# Patient Record
Sex: Male | Born: 1980 | Race: White | Hispanic: No | Marital: Married | Smoking: Never smoker
Health system: Southern US, Community
[De-identification: ages and names within clinical notes are randomized; demographics above are authoritative.]

## PROBLEM LIST (undated history)

## (undated) DIAGNOSIS — Q249 Congenital malformation of heart, unspecified: Secondary | ICD-10-CM

## (undated) DIAGNOSIS — I471 Supraventricular tachycardia, unspecified: Secondary | ICD-10-CM

## (undated) DIAGNOSIS — B019 Varicella without complication: Secondary | ICD-10-CM

## (undated) HISTORY — DX: Supraventricular tachycardia: I47.1

## (undated) HISTORY — PX: VASECTOMY: SHX75

## (undated) HISTORY — DX: Congenital malformation of heart, unspecified: Q24.9

## (undated) HISTORY — DX: Supraventricular tachycardia, unspecified: I47.10

## (undated) HISTORY — DX: Varicella without complication: B01.9

---

## 2004-06-20 HISTORY — PX: ABLATION: SHX5711

## 2017-01-30 ENCOUNTER — Encounter: Payer: Self-pay | Admitting: Primary Care

## 2017-01-30 ENCOUNTER — Encounter (INDEPENDENT_AMBULATORY_CARE_PROVIDER_SITE_OTHER): Payer: Self-pay

## 2017-01-30 ENCOUNTER — Ambulatory Visit (INDEPENDENT_AMBULATORY_CARE_PROVIDER_SITE_OTHER): Payer: Managed Care, Other (non HMO) | Admitting: Primary Care

## 2017-01-30 VITALS — BP 122/78 | HR 60 | Temp 98.0°F | Ht 72.0 in | Wt 191.8 lb

## 2017-01-30 DIAGNOSIS — Z23 Encounter for immunization: Secondary | ICD-10-CM

## 2017-01-30 DIAGNOSIS — I471 Supraventricular tachycardia: Secondary | ICD-10-CM | POA: Insufficient documentation

## 2017-01-30 DIAGNOSIS — Z0000 Encounter for general adult medical examination without abnormal findings: Secondary | ICD-10-CM | POA: Diagnosis not present

## 2017-01-30 DIAGNOSIS — Z3009 Encounter for other general counseling and advice on contraception: Secondary | ICD-10-CM

## 2017-01-30 LAB — LIPID PANEL
CHOLESTEROL: 112 mg/dL (ref 0–200)
HDL: 45.2 mg/dL (ref >39.00)
LDL CALC: 56 mg/dL (ref 0–99)
NonHDL: 66.46
Total CHOL/HDL Ratio: 2
Triglycerides: 51 mg/dL (ref 0.0–149.0)
VLDL: 10.2 mg/dL (ref 0.0–40.0)

## 2017-01-30 LAB — COMPREHENSIVE METABOLIC PANEL
ALBUMIN: 4.5 g/dL (ref 3.5–5.2)
ALK PHOS: 55 U/L (ref 39–117)
ALT: 15 U/L (ref 0–53)
AST: 16 U/L (ref 0–37)
BUN: 16 mg/dL (ref 6–23)
CO2: 27 mEq/L (ref 19–32)
CREATININE: 0.93 mg/dL (ref 0.40–1.50)
Calcium: 9.5 mg/dL (ref 8.4–10.5)
Chloride: 104 mEq/L (ref 96–112)
GFR: 98.5 mL/min (ref >60.00)
Glucose, Bld: 90 mg/dL (ref 70–99)
Potassium: 4.7 mEq/L (ref 3.5–5.1)
SODIUM: 139 meq/L (ref 135–145)
TOTAL PROTEIN: 6.8 g/dL (ref 6.0–8.3)
Total Bilirubin: 0.5 mg/dL (ref 0.2–1.2)

## 2017-01-30 NOTE — Patient Instructions (Addendum)
You were provided with a tetanus vaccination which will cover you for 10 years.  Complete lab work prior to leaving today. I will notify you of your results once received.   Start exercising. You should be getting 150 minutes of moderate intensity exercise weekly.  Ensure you are consuming 64 ounces of water daily.  Increase consumption of vegetables, fruit, whole grains.   You will be contacted regarding your referral to Urology.  Please let us know if you have not heard back within one week.   Follow up in 1 year for your annual exam or sooner if needed.  It was a pleasure to meet you today! Please don't hesitate to call me with any questions. Welcome to [facility]!

## 2017-01-30 NOTE — Assessment & Plan Note (Signed)
Following with cardiology through [facility], stable on flecainide. Continue same.

## 2017-01-30 NOTE — Progress Notes (Signed)
Subjective:    Patient ID: Robert Harrell, male    DOB: Nov 13, 1980, 36 y.o.   MRN: [id number]  HPI  Mr. Pokorski is a 36 year old male who presents today to establish care and for complete physical. He would like a referral to for vasectomy. Will obtain old records.   1) Paroxysmal SVT: Currently following with cardiology through [facility] and managed on Flecainide 50 mg once daily. Prior failed ablation in 2006. Family history of SVT in maternal grandmother. He denies chest pain, palpitations, dizziness. He is very aware of his symptoms with tachycardia and can typically yawn or stretch to prevent tachycardia.   2) Vasectomy Referral: Currently has two children (83 year old and 10 month old). Would like to have this procedure as he and his wife are finished having children.   Immunizations: -Tetanus: Completed over 10 years ago. -Influenza: Never completes.    Diet: Over the past 4 weeks he's eating a ketogenic and 816 diet. Breakfast: Berniece Salines, eggs Lunch: Salad with protein; fast food without bread Dinner: Meat, vegetable, cheese Snacks: Occasionally nuts, deli meat Desserts: None Beverages: Water, whisky   Exercise: Currently not exercising  Eye exam: Not completed recently Dental exam: Follows semi-annually   Review of Systems  Constitutional: Negative for unexpected weight change.  HENT: Negative for rhinorrhea.   Eyes: Negative for visual disturbance.  Respiratory: Negative for cough and shortness of breath.   Cardiovascular: Negative for chest pain.  Gastrointestinal: Negative for constipation and diarrhea.  Genitourinary: Negative for difficulty urinating.       Would like vasectomy referral  Musculoskeletal: Negative for arthralgias and myalgias.  Skin: Negative for rash.  Allergic/Immunologic: Negative for environmental allergies.  Neurological: Negative for dizziness, numbness and headaches.  Psychiatric/Behavioral:       Denies concerns for anxiety and depression         Past Medical History:  Diagnosis Date  . Cardiac arrhythmia due to congenital heart disease   . Chickenpox      Social History   Social History  . Marital status: Married    Spouse name: N/A  . Number of children: N/A  . Years of education: N/A   Occupational History  . Not on file.   Social History Main Topics  . Smoking status: Never Smoker  . Smokeless tobacco: Never Used  . Alcohol use Yes  . Drug use: Unknown  . Sexual activity: Not on file   Other Topics Concern  . Not on file   Social History Narrative   Married.   2 children.   Works as a Hotel manager for [employer].    Enjoys spending time with family, swimming.     No past surgical history on file.  Family History  Problem Relation Age of Onset  . Mental illness Mother   . Mental illness Sister   . Breast cancer Maternal Grandmother   . Heart disease Maternal Grandmother   . Stroke Maternal Grandmother   . Heart disease Maternal Grandfather   . Stroke Maternal Grandfather   . Lung cancer Paternal Grandmother   . Alcohol abuse Paternal Grandfather   . Diabetes Paternal Grandfather     No Known Allergies  No current outpatient prescriptions on file prior to visit.   No current facility-administered medications on file prior to visit.     BP 122/78   Pulse 60   Temp 98 F (36.7 C) (Oral)   Ht 6' (1.829 m)   Wt 191 lb 12.8 oz (87  kg)   SpO2 97%   BMI 26.01 kg/m    Objective:   Physical Exam  Constitutional: He is oriented to person, place, and time. He appears well-nourished.  HENT:  Right Ear: Tympanic membrane and ear canal normal.  Left Ear: Tympanic membrane and ear canal normal.  Nose: Nose normal. Right sinus exhibits no maxillary sinus tenderness and no frontal sinus tenderness. Left sinus exhibits no maxillary sinus tenderness and no frontal sinus tenderness.  Mouth/Throat: Oropharynx is clear and moist.  Eyes: Pupils are equal, round, and reactive to light.  Conjunctivae and EOM are normal.  Neck: Neck supple. Carotid bruit is not present. No thyromegaly present.  Cardiovascular: Normal rate, regular rhythm and normal heart sounds.   Pulmonary/Chest: Effort normal and breath sounds normal. He has no wheezes. He has no rales.  Abdominal: Soft. Bowel sounds are normal. There is no tenderness.  Musculoskeletal: Normal range of motion.  Neurological: He is alert and oriented to person, place, and time. He has normal reflexes. No cranial nerve deficit.  Skin: Skin is warm and dry.  Psychiatric: He has a normal mood and affect.          Assessment & Plan:

## 2017-01-30 NOTE — Addendum Note (Signed)
Addended by: Jacqualin Combes on: 01/30/2017 12:48 PM   Modules accepted: Orders

## 2017-01-30 NOTE — Assessment & Plan Note (Signed)
Tetanus due, provided today. Recommended he increase vegetables, fruit, whole grains. Recommended to start exercising. Exam unremarkable. Labs pending. Follow up in 1 year for annual exam.

## 2017-02-22 ENCOUNTER — Encounter: Payer: Self-pay | Admitting: Urology

## 2017-02-22 ENCOUNTER — Ambulatory Visit (INDEPENDENT_AMBULATORY_CARE_PROVIDER_SITE_OTHER): Payer: Managed Care, Other (non HMO) | Admitting: Urology

## 2017-02-22 VITALS — BP 126/87 | HR 77 | Ht 72.0 in | Wt 180.0 lb

## 2017-02-22 DIAGNOSIS — Z3009 Encounter for other general counseling and advice on contraception: Secondary | ICD-10-CM

## 2017-02-22 MED ORDER — DIAZEPAM 10 MG PO TABS: 10.0000 mg | ORAL_TABLET | Freq: Once | ORAL | 0 refills | Status: AC

## 2017-02-22 NOTE — Progress Notes (Signed)
02/22/2017 4:28 PM   Robert Harrell 02/27/1981 [id number]  Referring provider: [name], NP [name], [place] [address]  CC: Vasectomy consult  HPI:  36 year old male who presents today for possible cystoscopy. He is married with 2 children. He does wife desire no further biological pregnancies. They're currently not using any birth control methods" doing a bad job."  They consider other forms of birth control her most interested in vasectomy.  He denies any history of testicular pain or trauma. No urinary symptoms.   PMH: Past Medical History:  Diagnosis Date  . Cardiac arrhythmia due to congenital heart disease   . Chickenpox     Surgical History: Past Surgical History:  Procedure Laterality Date  . ABLATION  2006   Catheter    Home Medications:  Allergies as of 02/22/2017   No Known Allergies     Medication List       Accurate as of 02/22/17  4:28 PM. Always use your most recent med list.          flecainide 50 MG tablet Commonly known as:  TAMBOCOR Take 50 mg by mouth once.       Allergies: No Known Allergies  Family History: Family History  Problem Relation Age of Onset  . Mental illness Mother   . Mental illness Sister   . Breast cancer Maternal Grandmother   . Heart disease Maternal Grandmother   . Stroke Maternal Grandmother   . Heart disease Maternal Grandfather   . Stroke Maternal Grandfather   . Lung cancer Paternal Grandmother   . Alcohol abuse Paternal Grandfather   . Diabetes Paternal Grandfather     Social History:  reports that he has never smoked. He has never used smokeless tobacco. He reports that he drinks about 0.6 oz of alcohol per week . His drug history is not on file.  ROS: UROLOGY Frequent Urination?: No Hard to postpone urination?: No Burning/pain with urination?: No Get up at night to urinate?: No Leakage of urine?: No Urine stream starts and stops?: No Trouble starting stream?: No Do you  have to strain to urinate?: No Blood in urine?: No Urinary tract infection?: No Sexually transmitted disease?: No Injury to kidneys or bladder?: No Painful intercourse?: No Weak stream?: No Erection problems?: No Penile pain?: No  Gastrointestinal Nausea?: No Vomiting?: No Indigestion/heartburn?: No Diarrhea?: No Constipation?: No  Constitutional Fever: No Night sweats?: No Weight loss?: No Fatigue?: No  Skin Skin rash/lesions?: No Itching?: No  Eyes Blurred vision?: No Double vision?: No  Ears/Nose/Throat Sore throat?: No Sinus problems?: No  Hematologic/Lymphatic Swollen glands?: No Easy bruising?: No  Cardiovascular Leg swelling?: No Chest pain?: No  Respiratory Cough?: No Shortness of breath?: No  Endocrine Excessive thirst?: No  Musculoskeletal Back pain?: No Joint pain?: No  Neurological Headaches?: No Dizziness?: No  Psychologic Depression?: No Anxiety?: No  Physical Exam: BP 126/87 (BP Location: Left Arm, Patient Position: Sitting, Cuff Size: Normal)   Pulse 77   Ht 6' (1.829 m)   Wt 180 lb (81.6 kg)   BMI 24.41 kg/m   Constitutional:  Alert and oriented, No acute distress. HEENT: [place] AT, moist mucus membranes.  Trachea midline, no masses. Cardiovascular: No clubbing, cyanosis, or edema. Respiratory: Normal respiratory effort, no increased work of breathing. GI: Abdomen is soft, nontender, nondistended, no abdominal masses GU: Status post with bilateral descended testicles. Bilateral vasa easily palpable. Skin: No rashes, bruises or suspicious lesions. Neurologic: Grossly intact, no focal  deficits, moving all 4 extremities. Psychiatric: Normal mood and affect.  Laboratory Data:  Lab Results  Component Value Date   CREATININE 0.93 01/30/2017    Assessment & Plan:    1. Vasectomy evaluation Today, we discussed what the vas deferens is, where it is located, and its function. We reviewed the procedure for vasectomy, it's  risks, benefits, alternatives, and likelihood of achieving his goals. We discussed in detail the procedure, complications, and recovery as well as the need for clearance prior to unprotected intercourse. We discussed that vasectomy does not protect against sexually transmitted diseases. We discussed that this procedure does not result in immediate sterility and that they would need to use other forms of birth control until he has been cleared with negative postvasectomy semen analyses. I explained that the procedure is considered to be permanent and that attempts at reversal have varying degrees of success. These options include vasectomy reversal, sperm retrieval, and in vitro fertilization; these can be very expensive. We discussed the chance of postvasectomy pain syndrome which occurs in less than 5% of patients. I explained to the patient that there is no treatment to resolve this chronic pain, and that if it developed I would not be able to help resolve the issue, but that surgery is generally not needed for correction. I explained there have even been reports of systemic like illness associated with this chronic pain, and that there was no good cure. I explained that vasectomy it is not a 100% reliable form of birth control, and the risk of pregnancy after vasectomy is approximately 1 in 2000 men who had a negative postvasectomy semen analysis or rare non-motile sperm. I explained that repeat vasectomy was necessary in less than 1% of vasectomy procedures when employing the type of technique that I use. I explained that he should refrain from ejaculation for approximately one week following vasectomy. I explained that there are other options for birth control which are permanent and non-permanent; we discussed these. I explained the rates of surgical complications, such as symptomatic hematoma or infection, are low (1-2%) and vary with the surgeon's experience and criteria used to diagnose the complication.    The patient had the opportunity to ask questions to his stated satisfaction. He voiced understanding of the above factors and stated that he has read all the information provided to him and the packets and informed consent   Valium prescription given today, advised to have a driver.  Hollice Espy, MD  [facility] [address] [place],  [address] [phone]

## 2017-04-21 ENCOUNTER — Encounter: Payer: Self-pay | Admitting: Urology

## 2017-04-21 ENCOUNTER — Ambulatory Visit (INDEPENDENT_AMBULATORY_CARE_PROVIDER_SITE_OTHER): Payer: Managed Care, Other (non HMO) | Admitting: Urology

## 2017-04-21 VITALS — BP 131/82 | HR 83 | Ht 72.0 in | Wt 183.0 lb

## 2017-04-21 DIAGNOSIS — Z302 Encounter for sterilization: Secondary | ICD-10-CM

## 2017-04-21 NOTE — Progress Notes (Signed)
04/21/17  CC:  Chief Complaint  Patient presents with  . VAS    HPI: 36 year old male who presents today for vasectomy.  Risks and benefits were previously discussed.  All questions were answered.  Timeout was performed prior to the procedure.  Blood pressure 131/82, pulse 83, height 6' (1.829 m), weight 183 lb (83 kg). NED. A&Ox3.   No respiratory distress   Abd soft, NT, ND Normal external genitalia with patent urethral meatus   Bilateral Vasectomy Procedure  Pre-Procedure: - Patient's scrotum was prepped and draped for vasectomy. - The vas was palpated through the scrotal skin on the left. - 1% Xylocaine was injected into the skin and surrounding tissue for placement  - In a similar manner, the vas on the right was identified, anesthetized, and stabilized.  Procedure: - An #11 blade was used to make a small stab incision on the lateral aspect of the upper outer scrotum overlying the vas - The left vas was isolated and brought up through the incision exposing that structure. - Bleeding points were cauterized as they occurred. - The vas was free from the surrounding structures and brought to the view. - A segment was positioned for placement with a hemostat. - A second hemostat was placed and a small segment between the two hemostats and was removed for inspection. - Each end of the transected vas lumen was fulgurated/ obliterated using needlepoint electrocautery -A fascial interposition was performed on testicular end of the vas using #3-0 chromic suture -The same procedure was performed on the right. - A single suture of #3-0 chromic catgut was used to close each lateral scrotal skin incision - A dressing was applied.  Post-Procedure: - Patient was instructed in care of the operative area - A specimen is to be delivered in 12 weeks   -Another form of contraception is to be used until post vasectomy semen analysis  Hollice Espy, MD

## 2017-07-11 ENCOUNTER — Encounter: Payer: Self-pay | Admitting: Urology

## 2017-07-11 ENCOUNTER — Ambulatory Visit: Payer: Managed Care, Other (non HMO) | Admitting: Urology

## 2017-07-17 ENCOUNTER — Ambulatory Visit: Payer: Managed Care, Other (non HMO) | Admitting: Urology

## 2017-07-17 ENCOUNTER — Encounter: Payer: Self-pay | Admitting: Urology

## 2017-07-17 VITALS — BP 121/75 | HR 97 | Ht 72.0 in | Wt 190.0 lb

## 2017-07-17 DIAGNOSIS — Z9852 Vasectomy status: Secondary | ICD-10-CM

## 2017-07-17 NOTE — Progress Notes (Signed)
07/17/2017 2:15 PM   Robert Harrell [birth year] [id number]  Referring provider: [name], NP [place], [place] [address]  Chief Complaint  Patient presents with  . Post Vas    HPI: 37 year old male status post vasectomy on 04/21/2017.  He had no postoperative problems however states 2 weeks ago his daughter was sitting in his lap and stood up and accidentally stepped on his left testis.  He had minimal discomfort.  The following week he was in [place] and was doing a lot of walking.  He subsequently developed left scrotal pain and mild swelling.  He was taking ibuprofen with improvement.  He is feeling better and states the swelling resolved however he wanted to get checked to make sure this was nothing serious.   PMH: Past Medical History:  Diagnosis Date  . Cardiac arrhythmia due to congenital heart disease   . Chickenpox     Surgical History: Past Surgical History:  Procedure Laterality Date  . ABLATION  2006   Catheter    Home Medications:  Allergies as of 07/17/2017   No Known Allergies     Medication List        Accurate as of 07/17/17  2:15 PM. Always use your most recent med list.          flecainide 50 MG tablet Commonly known as:  TAMBOCOR Take 50 mg by mouth once.       Allergies: No Known Allergies  Family History: Family History  Problem Relation Age of Onset  . Mental illness Mother   . Mental illness Sister   . Breast cancer Maternal Grandmother   . Heart disease Maternal Grandmother   . Stroke Maternal Grandmother   . Heart disease Maternal Grandfather   . Stroke Maternal Grandfather   . Lung cancer Paternal Grandmother   . Alcohol abuse Paternal Grandfather   . Diabetes Paternal Grandfather     Social History:  reports that  has never smoked. he has never used smokeless tobacco. He reports that he drinks about 0.6 oz of alcohol per week. His drug history is not on file.  ROS: UROLOGY Frequent Urination?:  No Hard to postpone urination?: No Burning/pain with urination?: No Get up at night to urinate?: No Leakage of urine?: No Urine stream starts and stops?: No Trouble starting stream?: No Do you have to strain to urinate?: No Blood in urine?: No Urinary tract infection?: No Sexually transmitted disease?: No Injury to kidneys or bladder?: No Painful intercourse?: No Weak stream?: No Erection problems?: No Penile pain?: No  Gastrointestinal Nausea?: No Vomiting?: No Indigestion/heartburn?: No Diarrhea?: No Constipation?: No  Constitutional Fever: No Night sweats?: No Weight loss?: No Fatigue?: No  Skin Skin rash/lesions?: No Itching?: No  Eyes Blurred vision?: No Double vision?: No  Ears/Nose/Throat Sore throat?: No Sinus problems?: No  Hematologic/Lymphatic Swollen glands?: No Easy bruising?: No  Cardiovascular Leg swelling?: No Chest pain?: No  Respiratory Cough?: No Shortness of breath?: No  Endocrine Excessive thirst?: No  Musculoskeletal Back pain?: No Joint pain?: No  Neurological Headaches?: No Dizziness?: No  Psychologic Depression?: No Anxiety?: No  Physical Exam: BP 121/75   Pulse 97   Ht 6' (1.829 m)   Wt 190 lb (86.2 kg)   BMI 25.77 kg/m   Constitutional:  Alert and oriented, No acute distress. HEENT: [place] AT, moist mucus membranes.  Trachea midline, no masses. Cardiovascular: No clubbing, cyanosis, or edema. Respiratory: Normal respiratory effort, no increased work of  breathing. GI: Abdomen is soft, nontender, nondistended, no abdominal masses GU: No CVA tenderness.  Testes descended bilaterally.  small, nontender vas granulomas palpable.  Mild tenderness of the left epididymis though no induration or swelling. Skin: No rashes, bruises or suspicious lesions. Lymph: No cervical or inguinal adenopathy. Neurologic: Grossly intact, no focal deficits, moving all 4 extremities. Psychiatric: Normal mood and affect.   Assessment &  Plan:   He was reassured there are no significant findings on exam and consistent with a resolving post vasectomy epididymitis.  Follow-up as needed for any significant change in symptoms.  He brought in a semen sample today and will be notified with the results.    Abbie Sons, [place] [address] [place], [place] [address] [phone]

## 2018-01-31 ENCOUNTER — Encounter: Payer: Self-pay | Admitting: Primary Care

## 2018-01-31 ENCOUNTER — Ambulatory Visit (INDEPENDENT_AMBULATORY_CARE_PROVIDER_SITE_OTHER): Payer: Managed Care, Other (non HMO) | Admitting: Primary Care

## 2018-01-31 VITALS — BP 122/80 | HR 82 | Temp 97.6°F | Ht 72.0 in | Wt 190.2 lb

## 2018-01-31 DIAGNOSIS — I471 Supraventricular tachycardia: Secondary | ICD-10-CM

## 2018-01-31 DIAGNOSIS — Z0000 Encounter for general adult medical examination without abnormal findings: Secondary | ICD-10-CM | POA: Diagnosis not present

## 2018-01-31 LAB — CBC WITH DIFFERENTIAL/PLATELET
BASOS PCT: 0.8 % (ref 0.0–3.0)
Basophils Absolute: 0 10*3/uL (ref 0.0–0.1)
EOS PCT: 4.9 % (ref 0.0–5.0)
Eosinophils Absolute: 0.2 10*3/uL (ref 0.0–0.7)
HCT: 46.8 % (ref 39.0–52.0)
Hemoglobin: 16.1 g/dL (ref 13.0–17.0)
LYMPHS PCT: 37 % (ref 12.0–46.0)
Lymphs Abs: 1.5 10*3/uL (ref 0.7–4.0)
MCHC: 34.5 g/dL (ref 30.0–36.0)
MCV: 88.6 fl (ref 78.0–100.0)
MONO ABS: 0.4 10*3/uL (ref 0.1–1.0)
Monocytes Relative: 8.6 % (ref 3.0–12.0)
Neutro Abs: 2 10*3/uL (ref 1.4–7.7)
Neutrophils Relative %: 48.7 % (ref 43.0–77.0)
Platelets: 205 10*3/uL (ref 150.0–400.0)
RBC: 5.28 Mil/uL (ref 4.22–5.81)
RDW: 12.3 % (ref 11.5–15.5)
WBC: 4.2 10*3/uL (ref 4.0–10.5)

## 2018-01-31 LAB — LIPID PANEL
CHOL/HDL RATIO: 2
Cholesterol: 129 mg/dL (ref 0–200)
HDL: 56 mg/dL (ref >39.00)
LDL CALC: 64 mg/dL (ref 0–99)
NonHDL: 72.82
TRIGLYCERIDES: 46 mg/dL (ref 0.0–149.0)
VLDL: 9.2 mg/dL (ref 0.0–40.0)

## 2018-01-31 LAB — COMPREHENSIVE METABOLIC PANEL
ALT: 9 U/L (ref 0–53)
AST: 12 U/L (ref 0–37)
Albumin: 4.2 g/dL (ref 3.5–5.2)
Alkaline Phosphatase: 55 U/L (ref 39–117)
BUN: 16 mg/dL (ref 6–23)
CALCIUM: 9.8 mg/dL (ref 8.4–10.5)
CHLORIDE: 105 meq/L (ref 96–112)
CO2: 27 meq/L (ref 19–32)
CREATININE: 1.13 mg/dL (ref 0.40–1.50)
GFR: 78.21 mL/min (ref >60.00)
GLUCOSE: 93 mg/dL (ref 70–99)
Potassium: 4.5 mEq/L (ref 3.5–5.1)
SODIUM: 140 meq/L (ref 135–145)
Total Bilirubin: 0.7 mg/dL (ref 0.2–1.2)
Total Protein: 7.1 g/dL (ref 6.0–8.3)

## 2018-01-31 LAB — TSH: TSH: 1.57 u[IU]/mL (ref 0.35–4.50)

## 2018-01-31 NOTE — Assessment & Plan Note (Signed)
Td UTD. Commended him on an overall healthy diet, recommended to increase exercise.  Exam unremarkable. Labs pending. Follow up in 1 year for CPE.

## 2018-01-31 NOTE — Patient Instructions (Signed)
Stop by the lab prior to leaving today. I will notify you of your results once received.   Continue exercising. You should be getting 150 minutes of moderate intensity exercise weekly.  Continue to work on a healthy diet.   Ensure you are consuming 64 ounces of water daily.  Follow up in 1 year for your annual exam or sooner if needed.  It was a pleasure to see you today!   Preventive Care 18-39 Years, Male Preventive care refers to lifestyle choices and visits with your health care provider that can promote health and wellness. What does preventive care include?  A yearly physical exam. This is also called an annual well check.  Dental exams once or twice a year.  Routine eye exams. Ask your health care provider how often you should have your eyes checked.  Personal lifestyle choices, including: ? Daily care of your teeth and gums. ? Regular physical activity. ? Eating a healthy diet. ? Avoiding tobacco and drug use. ? Limiting alcohol use. ? Practicing safe sex. What happens during an annual well check? The services and screenings done by your health care provider during your annual well check will depend on your age, overall health, lifestyle risk factors, and family history of disease. Counseling Your health care provider may ask you questions about your:  Alcohol use.  Tobacco use.  Drug use.  Emotional well-being.  Home and relationship well-being.  Sexual activity.  Eating habits.  Work and work Statistician.  Screening You may have the following tests or measurements:  Height, weight, and BMI.  Blood pressure.  Lipid and cholesterol levels. These may be checked every 5 years starting at age 28.  Diabetes screening. This is done by checking your blood sugar (glucose) after you have not eaten for a while (fasting).  Skin check.  Hepatitis C blood test.  Hepatitis B blood test.  Sexually transmitted disease (STD) testing.  Discuss your test  results, treatment options, and if necessary, the need for more tests with your health care provider. Vaccines Your health care provider may recommend certain vaccines, such as:  Influenza vaccine. This is recommended every year.  Tetanus, diphtheria, and acellular pertussis (Tdap, Td) vaccine. You may need a Td booster every 10 years.  Varicella vaccine. You may need this if you have not been vaccinated.  HPV vaccine. If you are 54 or younger, you may need three doses over 6 months.  Measles, mumps, and rubella (MMR) vaccine. You may need at least one dose of MMR.You may also need a second dose.  Pneumococcal 13-valent conjugate (PCV13) vaccine. You may need this if you have certain conditions and have not been vaccinated.  Pneumococcal polysaccharide (PPSV23) vaccine. You may need one or two doses if you smoke cigarettes or if you have certain conditions.  Meningococcal vaccine. One dose is recommended if you are age 16-21 years and a first-year college student living in a residence hall, or if you have one of several medical conditions. You may also need additional booster doses.  Hepatitis A vaccine. You may need this if you have certain conditions or if you travel or work in places where you may be exposed to hepatitis A.  Hepatitis B vaccine. You may need this if you have certain conditions or if you travel or work in places where you may be exposed to hepatitis B.  Haemophilus influenzae type b (Hib) vaccine. You may need this if you have certain risk factors.  Talk to your health  care provider about which screenings and vaccines you need and how often you need them. This information is not intended to replace advice given to you by your health care provider. Make sure you discuss any questions you have with your health care provider. Document Released: 08/02/2001 Document Revised: 02/24/2016 Document Reviewed: 04/07/2015 Elsevier Interactive Patient Education  [employer].

## 2018-01-31 NOTE — Assessment & Plan Note (Signed)
No recent episodes of SVT, following with [facility]. Compliant to flecainide daily.

## 2018-01-31 NOTE — Progress Notes (Signed)
Subjective:    Patient ID: Robert Harrell, male    DOB: Aug 16, 1980, 37 y.o.   MRN: [id number]  HPI  Robert Harrell is a 37 year old male who presents today for complete physical.  Went to [place] on business in January. Previously having firm, once daily bowel movements. Since then he's having bowel movements 2-3 times daily which is more loose. He denies bloody stools, unexplained.   Immunizations: -Tetanus: Completed in 2018 -Influenza: Did not receive last season   Diet: He endorses a fair diet. Breakfast: Skips Lunch: Salad, chicken salad Dinner: Meat, vegetable, starch Snacks: None Desserts: Sugar free jello Beverages: Water, liquor   Exercise: Jogging once weekly Eye exam: No recent exam Dental exam: Completes semi-annually    Review of Systems  Constitutional: Negative for unexpected weight change.  HENT: Negative for rhinorrhea.   Respiratory: Negative for cough and shortness of breath.   Cardiovascular: Negative for chest pain.  Gastrointestinal: Negative for abdominal pain, blood in stool, constipation, diarrhea, nausea and vomiting.       Softer, more frequent stools since trip to [place] in January 2019.  Genitourinary: Negative for difficulty urinating.  Musculoskeletal: Negative for arthralgias and myalgias.  Skin: Negative for rash.  Allergic/Immunologic: Negative for environmental allergies.  Neurological: Negative for dizziness, numbness and headaches.  Psychiatric/Behavioral: The patient is not nervous/anxious.        Past Medical History:  Diagnosis Date  . Cardiac arrhythmia due to congenital heart disease   . Chickenpox      Social History   Socioeconomic History  . Marital status: Married    Spouse name: Not on file  . Number of children: 2  . Years of education: Not on file  . Highest education level: Not on file  Occupational History  . Occupation: Hotel manager  Social Needs  . Financial resource strain: Not on file  . Food  insecurity:    Worry: Not on file    Inability: Not on file  . Transportation needs:    Medical: Not on file    Non-medical: Not on file  Tobacco Use  . Smoking status: Never Smoker  . Smokeless tobacco: Never Used  Substance and Sexual Activity  . Alcohol use: Yes    Alcohol/week: 1.0 standard drinks    Types: 1 Standard drinks or equivalent per week    Comment: daily  . Drug use: Not on file  . Sexual activity: Not on file  Lifestyle  . Physical activity:    Days per week: Not on file    Minutes per session: Not on file  . Stress: Not on file  Relationships  . Social connections:    Talks on phone: Not on file    Gets together: Not on file    Attends religious service: Not on file    Active member of club or organization: Not on file    Attends meetings of clubs or organizations: Not on file    Relationship status: Not on file  . Intimate partner violence:    Fear of current or ex partner: Not on file    Emotionally abused: Not on file    Physically abused: Not on file    Forced sexual activity: Not on file  Other Topics Concern  . Not on file  Social History Narrative   Married.   2 children.   Works as a Hotel manager for [employer].    Enjoys spending time with family, swimming.     Past  Surgical History:  Procedure Laterality Date  . ABLATION  2006   Catheter    Family History  Problem Relation Age of Onset  . Mental illness Mother   . Mental illness Sister   . Breast cancer Maternal Grandmother   . Heart disease Maternal Grandmother   . Stroke Maternal Grandmother   . Heart disease Maternal Grandfather   . Stroke Maternal Grandfather   . Lung cancer Paternal Grandmother   . Alcohol abuse Paternal Grandfather   . Diabetes Paternal Grandfather     No Known Allergies  Current Outpatient Medications on File Prior to Visit  Medication Sig Dispense Refill  . flecainide (TAMBOCOR) 50 MG tablet Take 50 mg by mouth once.      No current  facility-administered medications on file prior to visit.     BP 122/80   Pulse 82   Temp 97.6 F (36.4 C) (Oral)   Ht 6' (1.829 m)   Wt 190 lb 4 oz (86.3 kg)   SpO2 98%   BMI 25.80 kg/m    Objective:   Physical Exam  Constitutional: He is oriented to person, place, and time. He appears well-nourished.  HENT:  Mouth/Throat: No oropharyngeal exudate.  Eyes: Pupils are equal, round, and reactive to light. EOM are normal.  Neck: Neck supple. No thyromegaly present.  Cardiovascular: Normal rate and regular rhythm.  Respiratory: Effort normal and breath sounds normal.  GI: Soft. Bowel sounds are normal. There is no tenderness.  Musculoskeletal: Normal range of motion.  Neurological: He is alert and oriented to person, place, and time.  Skin: Skin is warm and dry.  Psychiatric: He has a normal mood and affect.           Assessment & Plan:  Soft Stools:  He is worried about "worms" after bowels changed after a business trip to [place].  Abdominal exam unremarkable. He has no alarm signs. Start by checking CBC and TSH. Offered stool studies if abnormal.  Doubt acute infection as his trip was 8 months ago and he's had no symptoms other than stool changes.  Await labs.   [name], NP

## 2018-07-18 ENCOUNTER — Ambulatory Visit: Payer: Self-pay | Admitting: *Deleted

## 2018-07-18 ENCOUNTER — Ambulatory Visit (INDEPENDENT_AMBULATORY_CARE_PROVIDER_SITE_OTHER): Payer: Managed Care, Other (non HMO) | Admitting: Primary Care

## 2018-07-18 ENCOUNTER — Encounter: Payer: Self-pay | Admitting: Primary Care

## 2018-07-18 VITALS — BP 122/82 | HR 111 | Temp 99.3°F | Ht 72.0 in | Wt 199.5 lb

## 2018-07-18 DIAGNOSIS — J101 Influenza due to other identified influenza virus with other respiratory manifestations: Secondary | ICD-10-CM | POA: Diagnosis not present

## 2018-07-18 DIAGNOSIS — R509 Fever, unspecified: Secondary | ICD-10-CM | POA: Diagnosis not present

## 2018-07-18 LAB — POC INFLUENZA A&B (BINAX/QUICKVUE)
Influenza A, POC: NEGATIVE
Influenza B, POC: POSITIVE — AB

## 2018-07-18 NOTE — Patient Instructions (Signed)
You have influenza type B.  You can try a few things over the counter to help with your symptoms including:  Cough: Delsym or Robitussin (get the off brand, works just as well) Chest Congestion: Mucinex (plain) Nasal Congestion/Ear Pressure/Sinus Pressure: Try using Flonase (fluticasone) nasal spray. Instill 1 spray in each nostril twice daily. This can be purchased over the counter. Ibuprofen or Tylenol: Body aches, fevers, headache. You can alternate between the two.   Make sure to use the masks, wash your hands, cough into your clothing when at home.  It was a pleasure to see you today!   Influenza, Adult Influenza, more commonly known as "the flu," is a viral infection that mainly affects the respiratory tract. The respiratory tract includes organs that help you breathe, such as the lungs, nose, and throat. The flu causes many symptoms similar to the common cold along with high fever and body aches. The flu spreads easily from person to person (is contagious). Getting a flu shot (influenza vaccination) every year is the best way to prevent the flu. What are the causes? This condition is caused by the influenza virus. You can get the virus by:  Breathing in droplets that are in the air from an infected person's cough or sneeze.  Touching something that has been exposed to the virus (has been contaminated) and then touching your mouth, nose, or eyes. What increases the risk? The following factors may make you more likely to get the flu:  Not washing or sanitizing your hands often.  Having close contact with many people during cold and flu season.  Touching your mouth, eyes, or nose without first washing or sanitizing your hands.  Not getting a yearly (annual) flu shot. You may have a higher risk for the flu, including serious problems such as a lung infection (pneumonia), if you:  Are older than 65.  Are pregnant.  Have a weakened disease-fighting system (immune system). You  may have a weakened immune system if you: ? Have HIV or AIDS. ? Are undergoing chemotherapy. ? Are taking medicines that reduce (suppress) the activity of your immune system.  Have a long-term (chronic) illness, such as heart disease, kidney disease, diabetes, or lung disease.  Have a liver disorder.  Are severely overweight (morbidly obese).  Have anemia. This is a condition that affects your red blood cells.  Have asthma. What are the signs or symptoms? Symptoms of this condition usually begin suddenly and last 4-14 days. They may include:  Fever and chills.  Headaches, body aches, or muscle aches.  Sore throat.  Cough.  Runny or stuffy (congested) nose.  Chest discomfort.  Poor appetite.  Weakness or fatigue.  Dizziness.  Nausea or vomiting. How is this diagnosed? This condition may be diagnosed based on:  Your symptoms and medical history.  A physical exam.  Swabbing your nose or throat and testing the fluid for the influenza virus. How is this treated? If the flu is diagnosed early, you can be treated with medicine that can help reduce how severe the illness is and how long it lasts (antiviral medicine). This may be given by mouth (orally) or through an IV. Taking care of yourself at home can help relieve symptoms. Your health care provider may recommend:  Taking over-the-counter medicines.  Drinking plenty of fluids. In many cases, the flu goes away on its own. If you have severe symptoms or complications, you may be treated in a hospital. Follow these instructions at home: Activity  Rest  as needed and get plenty of sleep.  Stay home from work or school as told by your health care provider. Unless you are visiting your health care provider, avoid leaving home until your fever has been gone for 24 hours without taking medicine. Eating and drinking  Take an oral rehydration solution (ORS). This is a drink that is sold at pharmacies and retail  stores.  Drink enough fluid to keep your urine pale yellow.  Drink clear fluids in small amounts as you are able. Clear fluids include water, ice chips, diluted fruit juice, and low-calorie sports drinks.  Eat bland, easy-to-digest foods in small amounts as you are able. These foods include bananas, applesauce, rice, lean meats, toast, and crackers.  Avoid drinking fluids that contain a lot of sugar or caffeine, such as energy drinks, regular sports drinks, and soda.  Avoid alcohol.  Avoid spicy or fatty foods. General instructions      Take over-the-counter and prescription medicines only as told by your health care provider.  Use a cool mist humidifier to add humidity to the air in your home. This can make it easier to breathe.  Cover your mouth and nose when you cough or sneeze.  Wash your hands with soap and water often, especially after you cough or sneeze. If soap and water are not available, use alcohol-based hand sanitizer.  Keep all follow-up visits as told by your health care provider. This is important. How is this prevented?   Get an annual flu shot. You may get the flu shot in late summer, fall, or winter. Ask your health care provider when you should get your flu shot.  Avoid contact with people who are sick during cold and flu season. This is generally fall and winter. Contact a health care provider if:  You develop new symptoms.  You have: ? Chest pain. ? Diarrhea. ? A fever.  Your cough gets worse.  You produce more mucus.  You feel nauseous or you vomit. Get help right away if:  You develop shortness of breath or difficulty breathing.  Your skin or nails turn a bluish color.  You have severe pain or stiffness in your neck.  You develop a sudden headache or sudden pain in your face or ear.  You cannot eat or drink without vomiting. Summary  Influenza, more commonly known as "the flu," is a viral infection that primarily affects your  respiratory tract.  Symptoms of the flu usually begin suddenly and last 4-14 days.  Getting an annual flu shot is the best way to prevent getting the flu.  Stay home from work or school as told by your health care provider. Unless you are visiting your health care provider, avoid leaving home until your fever has been gone for 24 hours without taking medicine.  Keep all follow-up visits as told by your health care provider. This is important. This information is not intended to replace advice given to you by your health care provider. Make sure you discuss any questions you have with your health care provider. Document Released: 06/03/2000 Document Revised: 11/22/2017 Document Reviewed: 11/22/2017 Elsevier Interactive Patient Education  2019 [employer].

## 2018-07-18 NOTE — Addendum Note (Signed)
Addended by: Jacqualin Combes on: 07/18/2018 11:48 AM   Modules accepted: Orders

## 2018-07-18 NOTE — Telephone Encounter (Signed)
Patient seen earlier today by K. Carlis Abbott, NP dx with influenza B.  Thanks.

## 2018-07-18 NOTE — Telephone Encounter (Signed)
Pt called with having an elevated Temp of 103 today. He has been having fevers over the last 4 days of 100-102. He has taken Advil for it. He has body aches and chills. Has chest and head congestion. And a congested cough. More productive first thing in the morning. He works at [employer] and is in contact with people from [place].  He has shortness of breath on exertion.  He also has been having a cramp in his right calf. No reddness or swelling. But it does hurt when he is walking.  Flow at [facility] notified retarding an appointment for today. Appointment scheduled. Pt advised and he stated he can be there in 5 mins.  Routing to flow at [facility] at [facility].  Reason for Disposition . Fever present > 3 days (72 hours)  Answer Assessment - Initial Assessment Questions 1. TEMPERATURE: "What is the most recent temperature?"  "How was it measured?"      103 2. ONSET: "When did the fever start?"      Since Friday 3. SYMPTOMS: "Do you have any other symptoms besides the fever?"  (e.g., colds, headache, sore throat, earache, cough, rash, diarrhea, vomiting, abdominal pain)     Headache, sore throat, ears hurt, head and chest congestion 4. CAUSE: If there are no symptoms, ask: "What do you think is causing the fever?"      Don't know 5. CONTACTS: "Does anyone else in the family have an infection?"     no 6. TREATMENT: "What have you done so far to treat this fever?" (e.g., medications)     advil 7. IMMUNOCOMPROMISE: "Do you have of the following: diabetes, HIV positive, splenectomy, cancer chemotherapy, chronic steroid treatment, transplant patient, etc."     no 8. PREGNANCY: "Is there any chance you are pregnant?" "When was your last menstrual period?"     no 9. TRAVEL: "Have you traveled out of the country in the last month?" (e.g., travel history, exposures)     no  Protocols used: FEVER-A-AH

## 2018-07-18 NOTE — Progress Notes (Signed)
Subjective:    Patient ID: Robert Harrell, male    DOB: 11-Jan-1981, 38 y.o.   MRN: [id number]  HPI  Robert Harrell is a 38 year old male with a history of paroxysmal SVT who presents today with a chief complaint of fever.  He also reports body aches, sinus pressure, cough, nasal congestion, chills. His fever this morning was 103. His symptoms began five days ago. He's been taking Advil and Mucinex with some reduction in fever. He did not have his flu shot this year. He's had sick contact with his daughter several days prior who had similar symptoms.  Review of Systems  Constitutional: Positive for chills, fatigue and fever.  HENT: Positive for congestion, ear pain and sore throat.   Respiratory: Positive for cough.        Past Medical History:  Diagnosis Date  . Cardiac arrhythmia due to congenital heart disease   . Chickenpox      Social History   Socioeconomic History  . Marital status: Married    Spouse name: Not on file  . Number of children: 2  . Years of education: Not on file  . Highest education level: Not on file  Occupational History  . Occupation: Hotel manager  Social Needs  . Financial resource strain: Not on file  . Food insecurity:    Worry: Not on file    Inability: Not on file  . Transportation needs:    Medical: Not on file    Non-medical: Not on file  Tobacco Use  . Smoking status: Never Smoker  . Smokeless tobacco: Never Used  Substance and Sexual Activity  . Alcohol use: Yes    Alcohol/week: 1.0 standard drinks    Types: 1 Standard drinks or equivalent per week    Comment: daily  . Drug use: Not on file  . Sexual activity: Not on file  Lifestyle  . Physical activity:    Days per week: Not on file    Minutes per session: Not on file  . Stress: Not on file  Relationships  . Social connections:    Talks on phone: Not on file    Gets together: Not on file    Attends religious service: Not on file    Active member of club or  organization: Not on file    Attends meetings of clubs or organizations: Not on file    Relationship status: Not on file  . Intimate partner violence:    Fear of current or ex partner: Not on file    Emotionally abused: Not on file    Physically abused: Not on file    Forced sexual activity: Not on file  Other Topics Concern  . Not on file  Social History Narrative   Married.   2 children.   Works as a Hotel manager for [employer].    Enjoys spending time with family, swimming.     Past Surgical History:  Procedure Laterality Date  . ABLATION  2006   Catheter    Family History  Problem Relation Age of Onset  . Mental illness Mother   . Mental illness Sister   . Breast cancer Maternal Grandmother   . Heart disease Maternal Grandmother   . Stroke Maternal Grandmother   . Heart disease Maternal Grandfather   . Stroke Maternal Grandfather   . Lung cancer Paternal Grandmother   . Alcohol abuse Paternal Grandfather   . Diabetes Paternal Grandfather     No Known Allergies  Current  Outpatient Medications on File Prior to Visit  Medication Sig Dispense Refill  . flecainide (TAMBOCOR) 50 MG tablet Take by mouth.     No current facility-administered medications on file prior to visit.     BP 122/82   Pulse (!) 111   Temp 99.3 F (37.4 C) (Oral)   Ht 6' (1.829 m)   Wt 199 lb 8 oz (90.5 kg)   SpO2 98%   BMI 27.06 kg/m    Objective:   Physical Exam  Constitutional: He appears well-nourished. He does not appear ill.  HENT:  Right Ear: Ear canal normal. Tympanic membrane is not bulging.  Left Ear: Ear canal normal. Tympanic membrane is not bulging.  Nose: Mucosal edema present. Right sinus exhibits no maxillary sinus tenderness and no frontal sinus tenderness. Left sinus exhibits no maxillary sinus tenderness and no frontal sinus tenderness.  Mouth/Throat: Oropharynx is clear and moist.  Dullness with mild erythema bilateral TM's  Neck: Neck supple.  Cardiovascular:  Regular rhythm.  Sinus tachycardia  Respiratory: Effort normal and breath sounds normal. He has no wheezes.  Skin: Skin is warm and dry.           Assessment & Plan:  Influenza B:  Chills, body aches, fevers, cough x 5 days. Exam today with clear lungs, does appear ill, sinus tachycardia without SVT. Rapid Flu: Positive for B. Outside of Tamiflu window given that he's now on day 5 of symptoms. Discussed home care instructions, OTC treatment.  Offered Rx for cough medication, he kindly declines.  Fluids, rest, follow up PRN.  Robert Koch, NP

## 2018-08-09 ENCOUNTER — Ambulatory Visit (INDEPENDENT_AMBULATORY_CARE_PROVIDER_SITE_OTHER): Payer: Managed Care, Other (non HMO) | Admitting: Family Medicine

## 2018-08-09 ENCOUNTER — Encounter: Payer: Self-pay | Admitting: Family Medicine

## 2018-08-09 VITALS — BP 112/80 | HR 90 | Temp 99.2°F | Resp 20 | Ht 72.0 in | Wt 200.5 lb

## 2018-08-09 DIAGNOSIS — J101 Influenza due to other identified influenza virus with other respiratory manifestations: Secondary | ICD-10-CM | POA: Diagnosis not present

## 2018-08-09 DIAGNOSIS — R52 Pain, unspecified: Secondary | ICD-10-CM | POA: Diagnosis not present

## 2018-08-09 LAB — POCT INFLUENZA A/B
Influenza A, POC: POSITIVE — AB
Influenza B, POC: NEGATIVE

## 2018-08-09 MED ORDER — OSELTAMIVIR PHOSPHATE 75 MG PO CAPS: 75.0000 mg | ORAL_CAPSULE | Freq: Two times a day (BID) | ORAL | 0 refills | Status: AC

## 2018-08-09 NOTE — Patient Instructions (Signed)
You have the flu.   This is highly contagious -- you are contagious 1-2 days before you develop symptoms and for up to 7 days after becoming sick.   Oseltamivir (Tamiflu) -- is helpful if started within 48 hours of symptoms. Ideally within 24 hours of symptoms. This may decrease the length of illness by 1 day and decrease the severity of your illness.   Based on your symptoms, it looks like you have a virus.   Antibiotics are not need for a viral infection but the following will help:   1. Drink plenty of fluids 2. Get lots of rest  Sinus Congestion 1) Neti Pot (Saline rinse) -- 2 times day -- if tolerated 2) Flonase (Store Brand ok) - once daily 3) Over the counter congestion medications  Cough 1) Cough drops can be helpful 2) Nyquil (or nighttime cough medication) 3) Honey is proven to be one of the best cough medications   Sore Throat 1) Honey as above, cough drops 2) Ibuprofen or Aleve can be helpful 3) Salt water Gargles  If you develop fevers (Temperature >100.4), chills, worsening symptoms or symptoms lasting longer than 10 days return to clinic.

## 2018-08-09 NOTE — Progress Notes (Signed)
Subjective:     Robert Harrell is a 38 y.o. male presenting for Fever (off and on for 3 weeks. Fever 102.5, achy, chills, cough, sore throat, headache. Feels the same like he did when he wasdiagnosed with Flu B in january except he also has a cough now.)     Fever   This is a new problem. The current episode started in the past 7 days. The maximum temperature noted was 102 to 102.9 F. Associated symptoms include congestion, coughing, headaches and a sore throat. Pertinent negatives include no abdominal pain, diarrhea, nausea or vomiting. He has tried NSAIDs for the symptoms.     Review of Systems  Constitutional: Positive for fever.  HENT: Positive for congestion and sore throat.   Respiratory: Positive for cough.   Gastrointestinal: Negative for abdominal pain, diarrhea, nausea and vomiting.  Neurological: Positive for headaches.     Social History   Tobacco Use  Smoking Status Never Smoker  Smokeless Tobacco Never Used        Objective:    BP Readings from Last 3 Encounters:  08/09/18 112/80  07/18/18 122/82  01/31/18 122/80   Wt Readings from Last 3 Encounters:  08/09/18 200 lb 8 oz (90.9 kg)  07/18/18 199 lb 8 oz (90.5 kg)  01/31/18 190 lb 4 oz (86.3 kg)    BP 112/80   Pulse 90   Temp 99.2 F (37.3 C)   Resp 20   Ht 6' (1.829 m)   Wt 200 lb 8 oz (90.9 kg)   SpO2 97%   BMI 27.19 kg/m    Physical Exam Constitutional:      General: He is not in acute distress.    Appearance: Normal appearance. He is well-developed. He is not ill-appearing or diaphoretic.  HENT:     Head: Normocephalic and atraumatic.     Right Ear: Tympanic membrane, ear canal and external ear normal.     Left Ear: Tympanic membrane, ear canal and external ear normal.     Nose: Nose normal.     Right Sinus: No maxillary sinus tenderness or frontal sinus tenderness.     Left Sinus: No maxillary sinus tenderness or frontal sinus tenderness.     Mouth/Throat:     Pharynx: Uvula  midline. Posterior oropharyngeal erythema present. No oropharyngeal exudate.     Tonsils: Swelling: 0 on the right. 0 on the left.  Eyes:     General: No scleral icterus.    Extraocular Movements: Extraocular movements intact.     Conjunctiva/sclera: Conjunctivae normal.  Neck:     Musculoskeletal: Neck supple.  Cardiovascular:     Rate and Rhythm: Normal rate and regular rhythm.     Heart sounds: No murmur.  Pulmonary:     Effort: Pulmonary effort is normal. No respiratory distress.     Breath sounds: Normal breath sounds.  Lymphadenopathy:     Cervical: No cervical adenopathy.  Skin:    General: Skin is warm and dry.     Capillary Refill: Capillary refill takes less than 2 seconds.  Neurological:     Mental Status: He is alert. Mental status is at baseline.  Psychiatric:        Mood and Affect: Mood normal.     Rapid Flu: + A      Assessment & Plan:   Problem List Items Addressed This Visit    None    Visit Diagnoses    Influenza A    -  Primary  Relevant Medications   oseltamivir (TAMIFLU) 75 MG capsule   Body aches       Relevant Orders   POCT Influenza A/B (Completed)     Symptomatic care Advised that he call his children's pediatrician for treatment and or prophylaxis    Return if symptoms worsen or fail to improve.  Lesleigh Noe, MD

## 2018-09-21 ENCOUNTER — Telehealth: Payer: Self-pay

## 2018-09-21 DIAGNOSIS — I471 Supraventricular tachycardia: Secondary | ICD-10-CM

## 2018-09-21 NOTE — Telephone Encounter (Signed)
Please call pt and advise if pt needs in person or virtual visit. Referral in epic from PCP. Pt has been followed by [facility] cardiology.

## 2018-09-21 NOTE — Telephone Encounter (Signed)
Pt rqst a e-visit due to COVID-19. 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 Pt has a hx of PSVT that is well controlled and needs to estbblish care locally. E-visit scheduled with Dr. Fletcher Anon for 09/24/18 @4 :20pm. Pt aware of the appt date and time. Adv the pt that he will be contacted 15 min prior to the appt. Pt records in are in [place] and care everywhere. Pt will attempt to have his BP and HR ck at his local pharmacy the day of prior to the appt,  Verbal consent given.     Virtual Visit Pre-Appointment Phone Call  Steps For Call:     TELEPHONE CALL NOTE  Klint Lezcano has been deemed a candidate for a follow-up tele-health visit to limit community exposure during the Covid-19 pandemic. I spoke with the patient via phone to ensure availability of phone/video source, confirm preferred email & phone number, and discuss instructions and expectations.  I reminded Dabney Schanz to be prepared with any vital sign and/or heart rhythm information that could potentially be obtained via home monitoring, at the time of his visit. I reminded Jacon Whetzel to expect a phone call at the time of his visit if his visit.  Did the patient verbally acknowledge consent to treatment? yes Lamar Laundry, RN 09/21/2018 4:12 PM   DOWNLOADING THE WEBEX SOFTWARE TO SMARTPHONE  - If Apple, go to [employer] and type in WebEx in the search bar. [place] [employer], the blue/green circle. The app is free but as with any other app downloads, their phone may require them to verify saved payment information or Apple password. The patient does NOT have to create an account.  - If Android, ask patient to go to [employer] and type in WebEx in the search bar. [place] [employer], the blue/green circle. The app is free but as with any other app downloads, their phone may require them to verify saved payment information or Android password. The patient does NOT  have to create an account.   CONSENT FOR TELE-HEALTH VISIT - PLEASE REVIEW  I hereby voluntarily request, consent and authorize [facility] and its employed or contracted physicians, physician assistants, nurse practitioners or other licensed health care professionals (the Practitioner), to provide me with telemedicine health care services (the "Services") as deemed necessary by the treating Practitioner. I acknowledge and consent to receive the Services by the Practitioner via telemedicine. I understand that the telemedicine visit will involve communicating with the Practitioner through live audiovisual communication technology and the disclosure of certain medical information by electronic transmission. I acknowledge that I have been given the opportunity to request an in-person assessment or other available alternative prior to the telemedicine visit and am voluntarily participating in the telemedicine visit.  I understand that I have the right to withhold or withdraw my consent to the use of telemedicine in the course of my care at any time, without affecting my right to future care or treatment, and that the Practitioner or I may terminate the telemedicine visit at any time. I understand that I have the right to inspect all information obtained and/or recorded in the course of the telemedicine visit and may receive copies of available information for a reasonable fee.  I understand that some of the potential risks of receiving the Services via telemedicine include:  Marland Kitchen Delay or interruption in medical evaluation due to technological equipment failure or disruption; . Information transmitted may not be  sufficient (e.g. poor resolution of images) to allow for appropriate medical decision making by the Practitioner; and/or  . In rare instances, security protocols could fail, causing a breach of personal health information.  Furthermore, I acknowledge that it is my responsibility to provide  information about my medical history, conditions and care that is complete and accurate to the best of my ability. I acknowledge that Practitioner's advice, recommendations, and/or decision may be based on factors not within their control, such as incomplete or inaccurate data provided by me or distortions of diagnostic images or specimens that may result from electronic transmissions. I understand that the practice of medicine is not an exact science and that Practitioner makes no warranties or guarantees regarding treatment outcomes. I acknowledge that I will receive a copy of this consent concurrently upon execution via email to the email address I last provided but may also request a printed copy by calling the office of [place].    I understand that my insurance will be billed for this visit.   I have read or had this consent read to me. . I understand the contents of this consent, which adequately explains the benefits and risks of the Services being provided via telemedicine.  . I have been provided ample opportunity to ask questions regarding this consent and the Services and have had my questions answered to my satisfaction. . I give my informed consent for the services to be provided through the use of telemedicine in my medical care  By participating in this telemedicine visit I agree to the above.

## 2018-09-25 ENCOUNTER — Encounter: Payer: Self-pay | Admitting: Cardiovascular Disease

## 2018-09-25 ENCOUNTER — Telehealth (INDEPENDENT_AMBULATORY_CARE_PROVIDER_SITE_OTHER): Payer: Managed Care, Other (non HMO) | Admitting: Cardiovascular Disease

## 2018-09-25 ENCOUNTER — Telehealth: Payer: Managed Care, Other (non HMO) | Admitting: Cardiovascular Disease

## 2018-09-25 ENCOUNTER — Other Ambulatory Visit: Payer: Self-pay

## 2018-09-25 VITALS — BP 134/88 | HR 84 | Ht 72.0 in | Wt 196.0 lb

## 2018-09-25 DIAGNOSIS — Z79899 Other long term (current) drug therapy: Secondary | ICD-10-CM

## 2018-09-25 DIAGNOSIS — Z7189 Other specified counseling: Secondary | ICD-10-CM | POA: Diagnosis not present

## 2018-09-25 DIAGNOSIS — I471 Supraventricular tachycardia: Secondary | ICD-10-CM

## 2018-09-25 MED ORDER — FLECAINIDE ACETATE 50 MG PO TABS: 50.0000 mg | ORAL_TABLET | Freq: Every day | ORAL | 2 refills | Status: DC

## 2018-09-25 NOTE — Progress Notes (Signed)
Virtual Visit via Video Note   This visit type was conducted due to national recommendations for restrictions regarding the COVID-19 Pandemic (e.g. social distancing) in an effort to limit this patient's exposure and mitigate transmission in our community.  Due to his co-morbid illnesses, this patient is at least at moderate risk for complications without adequate follow up.  This format is felt to be most appropriate for this patient at this time.  All issues noted in this document were discussed and addressed.  A limited physical exam was performed with this format.  Please refer to the patient's chart for his consent to telehealth for [facility].   Evaluation Performed:  Follow-up visit  Date:  09/25/2018   ID:  Robert Harrell, DOB [birth year], MRN [id number]  Patient Location: Home  Provider Location: Office  PCP:  [name], NP  Cardiologist:  No primary care provider on file.  Electrophysiologist:  None   Chief Complaint: New visit to establish regarding paroxysmal supraventricular tachycardia  History of Present Illness:    Robert Harrell is a 38 y.o. male who presents via audio/video conferencing for a telehealth visit today.    He is here to establish cardiovascular care regarding paroxysmal supraventricular tachycardia.  He used to follow-up at [facility].  He had SVT ablation in 2005.  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  According to him, he continued to have episodes of SVT after that and was placed on flecainide which has controlled his symptoms very well.  On average, he gets one episode of SVT and tachycardia a year that usually responds to vagal maneuvers.  He denies any chest pain or shortness of breath. He is not a smoker.  Both his mother and grandmother have SVT. The patient had the flu in February but has recovered from that.  The patient does not have symptoms concerning for COVID-19 infection  (fever, chills, cough, or new shortness of breath).    Past Medical History:  Diagnosis Date  . Cardiac arrhythmia due to congenital heart disease   . Chickenpox    Past Surgical History:  Procedure Laterality Date  . ABLATION  2006   Catheter     Current Meds  Medication Sig  . flecainide (TAMBOCOR) 50 MG tablet Take 50 mg by mouth daily.      Allergies:   Patient has no known allergies.   Social History   Tobacco Use  . Smoking status: Never Smoker  . Smokeless tobacco: Never Used  Substance Use Topics  . Alcohol use: Yes    Alcohol/week: 1.0 standard drinks    Types: 1 Standard drinks or equivalent per week    Comment: daily  . Drug use: Not on file     Family Hx: The patient's family history includes Alcohol abuse in his paternal grandfather; Breast cancer in his maternal grandmother; Diabetes in his paternal grandfather; Heart disease in his maternal grandfather and maternal grandmother; Lung cancer in his paternal grandmother; Mental illness in his mother and sister; Stroke in his maternal grandfather and maternal grandmother.  ROS:   Please see the history of present illness.     All other systems reviewed and are negative.   Prior CV studies:   The following studies were reviewed today:    Labs/Other Tests and Data Reviewed:    EKG:  No ECG reviewed.  Recent Labs: 01/31/2018: ALT 9; BUN 16; Creatinine, Ser 1.13; Hemoglobin 16.1; Platelets 205.0; Potassium 4.5;  Sodium 140; TSH 1.57   Recent Lipid Panel Lab Results  Component Value Date/Time   CHOL 129 01/31/2018 08:39 AM   TRIG 46.0 01/31/2018 08:39 AM   HDL 56.00 01/31/2018 08:39 AM   CHOLHDL 2 01/31/2018 08:39 AM   LDLCALC 64 01/31/2018 08:39 AM    Wt Readings from Last 3 Encounters:  09/25/18 196 lb (88.9 kg)  08/09/18 200 lb 8 oz (90.9 kg)  07/18/18 199 lb 8 oz (90.5 kg)     Objective:    Vital Signs:  BP 134/88   Pulse 84   Ht 6' (1.829 m)   Wt 196 lb (88.9 kg)   BMI 26.58 kg/m     Well nourished, well developed male in no acute distress.   ASSESSMENT & PLAN:    1. Paroxysmal supraventricular tachycardia: Prior ablation which was not successful.  The patient has been stable on flecainide 50 mg once daily.  This was refilled today.  The patient has no symptoms suggestive of heart failure or angina.  COVID-19 Education: The signs and symptoms of COVID-19 were discussed with the patient and how to seek care for testing (follow up with PCP or arrange E-visit).  The importance of social distancing was discussed today.  Time:   Today, I have spent 30 minutes with the patient with telehealth technology discussing the above problems.     Medication Adjustments/Labs and Tests Ordered: Current medicines are reviewed at length with the patient today.  Concerns regarding medicines are outlined above.  Tests Ordered: No orders of the defined types were placed in this encounter.  Medication Changes: No orders of the defined types were placed in this encounter.   Disposition:  Follow up in 6 month(s)  Signed, Kathlyn Sacramento, MD  09/25/2018 4:16 PM    [facility]

## 2018-09-25 NOTE — Patient Instructions (Signed)
Medication Instructions:  Continue flecainide which was refilled today If you need a refill on your cardiac medications before your next appointment, please call your pharmacy.   Lab work: None If you have labs (blood work) drawn today and your tests are completely normal, you will receive your results only by: Marland Kitchen MyChart Message (if you have MyChart) OR . A paper copy in the mail If you have any lab test that is abnormal or we need to change your treatment, we will call you to review the results.  Testing/Procedures: None  Follow-Up: At [facility], you and your health needs are our priority.  As part of our continuing mission to provide you with exceptional heart care, we have created designated Provider Care Teams.  These Care Teams include your primary Cardiologist (physician) and Advanced Practice Providers (APPs -  Physician Assistants and Nurse Practitioners) who all work together to provide you with the care you need, when you need it. You will need a follow up appointment in 6 months.  Please call our office 2 months in advance to schedule this appointment.  You may see Dr. Fletcher Anon or one of the following Advanced Practice Providers on your designated Care Team:   Murray Hodgkins, NP Christell Faith, PA-C . Marrianne Mood, PA-C

## 2019-02-07 ENCOUNTER — Encounter: Payer: Managed Care, Other (non HMO) | Admitting: Primary Care

## 2019-04-01 ENCOUNTER — Ambulatory Visit (INDEPENDENT_AMBULATORY_CARE_PROVIDER_SITE_OTHER): Payer: BC Managed Care – PPO | Admitting: Primary Care

## 2019-04-01 ENCOUNTER — Encounter: Payer: Self-pay | Admitting: Primary Care

## 2019-04-01 ENCOUNTER — Telehealth: Payer: Self-pay

## 2019-04-01 ENCOUNTER — Other Ambulatory Visit: Payer: Self-pay

## 2019-04-01 VITALS — BP 120/76 | HR 87 | Temp 97.8°F | Ht 72.0 in | Wt 206.0 lb

## 2019-04-01 DIAGNOSIS — Z0000 Encounter for general adult medical examination without abnormal findings: Secondary | ICD-10-CM

## 2019-04-01 DIAGNOSIS — Z23 Encounter for immunization: Secondary | ICD-10-CM

## 2019-04-01 DIAGNOSIS — I471 Supraventricular tachycardia: Secondary | ICD-10-CM | POA: Diagnosis not present

## 2019-04-01 NOTE — Assessment & Plan Note (Signed)
Rate and rhythm regular today. Following with cardiology. Continue flecainide.

## 2019-04-01 NOTE — Telephone Encounter (Signed)
Patient evaluated today, no suspicion for Covid-19.

## 2019-04-01 NOTE — Progress Notes (Signed)
Subjective:    Patient ID: Robert Harrell, male    DOB: [birth year], 38 y.o.   MRN: [id number]  HPI  Robert Harrell is a 38 year old male who presents today for complete physical.  Immunizations: -Tetanus: Completed in 2018 -Influenza: Due today  Diet: He endorses a healthy diet. He is eating a mediterranean diet. Desserts infrequently. Drinking water, juice, bourbon at night Exercise: He is not exercising   Eye exam: Completed several years ago Dental exam: Completes semi-annually   BP Readings from Last 3 Encounters:  04/01/19 120/76  09/25/18 134/88  08/09/18 112/80      Review of Systems  Constitutional: Negative for unexpected weight change.  HENT: Negative for rhinorrhea.   Respiratory: Negative for cough and shortness of breath.   Cardiovascular: Negative for chest pain.  Gastrointestinal: Negative for constipation and diarrhea.  Genitourinary: Negative for difficulty urinating.  Musculoskeletal: Negative for arthralgias and myalgias.  Skin: Negative for rash.  Allergic/Immunologic: Negative for environmental allergies.  Neurological: Negative for dizziness, numbness and headaches.  Psychiatric/Behavioral: The patient is not nervous/anxious.        Past Medical History:  Diagnosis Date  . Cardiac arrhythmia due to congenital heart disease   . Chickenpox      Social History   Socioeconomic History  . Marital status: Married    Spouse name: Not on file  . Number of children: 2  . Years of education: Not on file  . Highest education level: Not on file  Occupational History  . Occupation: Hotel manager  Social Needs  . Financial resource strain: Not on file  . Food insecurity    Worry: Not on file    Inability: Not on file  . Transportation needs    Medical: Not on file    Non-medical: Not on file  Tobacco Use  . Smoking status: Never Smoker  . Smokeless tobacco: Never Used  Substance and Sexual Activity  . Alcohol use: Yes    Alcohol/week:  1.0 standard drinks    Types: 1 Standard drinks or equivalent per week    Comment: daily  . Drug use: Not on file  . Sexual activity: Not on file  Lifestyle  . Physical activity    Days per week: Not on file    Minutes per session: Not on file  . Stress: Not on file  Relationships  . Social Herbalist on phone: Not on file    Gets together: Not on file    Attends religious service: Not on file    Active member of club or organization: Not on file    Attends meetings of clubs or organizations: Not on file    Relationship status: Not on file  . Intimate partner violence    Fear of current or ex partner: Not on file    Emotionally abused: Not on file    Physically abused: Not on file    Forced sexual activity: Not on file  Other Topics Concern  . Not on file  Social History Narrative   Married.   2 children.   Works as a Hotel manager for [employer].    Enjoys spending time with family, swimming.     Past Surgical History:  Procedure Laterality Date  . ABLATION  2006   Catheter    Family History  Problem Relation Age of Onset  . Mental illness Mother   . Mental illness Sister   . Breast cancer Maternal Grandmother   .  Heart disease Maternal Grandmother   . Stroke Maternal Grandmother   . Heart disease Maternal Grandfather   . Stroke Maternal Grandfather   . Lung cancer Paternal Grandmother   . Alcohol abuse Paternal Grandfather   . Diabetes Paternal Grandfather     No Known Allergies  Current Outpatient Medications on File Prior to Visit  Medication Sig Dispense Refill  . flecainide (TAMBOCOR) 50 MG tablet Take 1 tablet (50 mg total) by mouth daily. 90 tablet 2   No current facility-administered medications on file prior to visit.     BP 120/76   Pulse 87   Temp 97.8 F (36.6 C) (Temporal)   Ht 6' (1.829 m)   Wt 206 lb (93.4 kg)   SpO2 98%   BMI 27.94 kg/m    Objective:   Physical Exam  Constitutional: He is oriented to person, place, and  time. He appears well-nourished.  HENT:  Right Ear: Tympanic membrane and ear canal normal.  Left Ear: Tympanic membrane and ear canal normal.  Mouth/Throat: Oropharynx is clear and moist.  Eyes: Pupils are equal, round, and reactive to light. EOM are normal.  Neck: Neck supple.  Cardiovascular: Normal rate and regular rhythm.  Respiratory: Effort normal and breath sounds normal.  GI: Soft. Bowel sounds are normal. There is no abdominal tenderness.  Musculoskeletal: Normal range of motion.  Neurological: He is alert and oriented to person, place, and time.  Skin: Skin is warm and dry.  Psychiatric: He has a normal mood and affect.           Assessment & Plan:

## 2019-04-01 NOTE — Assessment & Plan Note (Signed)
Tetanus UTD, influenza vaccination provided. Encouraged regular exercise, healthy diet.  Exam today unremarkable. Labs pending. Follow up in 1 year for CPE.

## 2019-04-01 NOTE — Patient Instructions (Signed)
Stop by the lab prior to leaving today. I will notify you of your results once received.   Start exercising. You should be getting 150 minutes of moderate intensity exercise weekly.  Continue to eat a healthy diet. Ensure you are consuming 64 ounces of water daily.  It was a pleasure to see you today!   Preventive Care 30-36 Years Old, Male Preventive care refers to lifestyle choices and visits with your health care provider that can promote health and wellness. This includes:  A yearly physical exam. This is also called an annual well check.  Regular dental and eye exams.  Immunizations.  Screening for certain conditions.  Healthy lifestyle choices, such as eating a healthy diet, getting regular exercise, not using drugs or products that contain nicotine and tobacco, and limiting alcohol use. What can I expect for my preventive care visit? Physical exam Your health care provider will check:  Height and weight. These may be used to calculate body mass index (BMI), which is a measurement that tells if you are at a healthy weight.  Heart rate and blood pressure.  Your skin for abnormal spots. Counseling Your health care provider may ask you questions about:  Alcohol, tobacco, and drug use.  Emotional well-being.  Home and relationship well-being.  Sexual activity.  Eating habits.  Work and work Statistician. What immunizations do I need?  Influenza (flu) vaccine  This is recommended every year. Tetanus, diphtheria, and pertussis (Tdap) vaccine  You may need a Td booster every 10 years. Varicella (chickenpox) vaccine  You may need this vaccine if you have not already been vaccinated. Human papillomavirus (HPV) vaccine  If recommended by your health care provider, you may need three doses over 6 months. Measles, mumps, and rubella (MMR) vaccine  You may need at least one dose of MMR. You may also need a second dose. Meningococcal conjugate (MenACWY) vaccine   One dose is recommended if you are 53-36 years old and a Market researcher living in a residence hall, or if you have one of several medical conditions. You may also need additional booster doses. Pneumococcal conjugate (PCV13) vaccine  You may need this if you have certain conditions and were not previously vaccinated. Pneumococcal polysaccharide (PPSV23) vaccine  You may need one or two doses if you smoke cigarettes or if you have certain conditions. Hepatitis A vaccine  You may need this if you have certain conditions or if you travel or work in places where you may be exposed to hepatitis A. Hepatitis B vaccine  You may need this if you have certain conditions or if you travel or work in places where you may be exposed to hepatitis B. Haemophilus influenzae type b (Hib) vaccine  You may need this if you have certain risk factors. You may receive vaccines as individual doses or as more than one vaccine together in one shot (combination vaccines). Talk with your health care provider about the risks and benefits of combination vaccines. What tests do I need? Blood tests  Lipid and cholesterol levels. These may be checked every 5 years starting at age 42.  Hepatitis C test.  Hepatitis B test. Screening   Diabetes screening. This is done by checking your blood sugar (glucose) after you have not eaten for a while (fasting).  Sexually transmitted disease (STD) testing. Talk with your health care provider about your test results, treatment options, and if necessary, the need for more tests. Follow these instructions at home: Eating and drinking  Eat a diet that includes fresh fruits and vegetables, whole grains, lean protein, and low-fat dairy products.  Take vitamin and mineral supplements as recommended by your health care provider.  Do not drink alcohol if your health care provider tells you not to drink.  If you drink alcohol: ? Limit how much you have to 0-2  drinks a day. ? Be aware of how much alcohol is in your drink. In the [place] one drink equals one 12 oz bottle of beer (355 mL), one 5 oz glass of wine (148 mL), or one 1 oz glass of hard liquor (44 mL). Lifestyle  Take daily care of your teeth and gums.  Stay active. Exercise for at least 30 minutes on 5 or more days each week.  Do not use any products that contain nicotine or tobacco, such as cigarettes, e-cigarettes, and chewing tobacco. If you need help quitting, ask your health care provider.  If you are sexually active, practice safe sex. Use a condom or other form of protection to prevent STIs (sexually transmitted infections). What's next?  Go to your health care provider once a year for a well check visit.  Ask your health care provider how often you should have your eyes and teeth checked.  Stay up to date on all vaccines. This information is not intended to replace advice given to you by your health care provider. Make sure you discuss any questions you have with your health care provider. Document Released: 08/02/2001 Document Revised: 05/31/2018 Document Reviewed: 05/31/2018 Elsevier Patient Education  2020 [employer].

## 2019-04-01 NOTE — Telephone Encounter (Signed)
Pt said starting last night at 3:30 AM pt started with loose stool; no blood or mucus; had loose stools x 3; last time this morning at 7:30 AM. Pt ate homemade chili beans last night; now T 97.4.no other covid symptoms.Gentry Fitz NP said OK to bring pt in office for annual.Chan CMA will bring pt in office.

## 2019-04-01 NOTE — Addendum Note (Signed)
Addended by: Jacqualin Combes on: 04/01/2019 02:58 PM   Modules accepted: Orders

## 2019-04-02 LAB — COMPREHENSIVE METABOLIC PANEL
ALT: 20 U/L (ref 0–53)
AST: 20 U/L (ref 0–37)
Albumin: 4.2 g/dL (ref 3.5–5.2)
Alkaline Phosphatase: 64 U/L (ref 39–117)
BUN: 11 mg/dL (ref 6–23)
CO2: 30 mEq/L (ref 19–32)
Calcium: 9.6 mg/dL (ref 8.4–10.5)
Chloride: 106 mEq/L (ref 96–112)
Creatinine, Ser: 0.9 mg/dL (ref 0.40–1.50)
GFR: 95.06 mL/min (ref >60.00)
Glucose, Bld: 98 mg/dL (ref 70–99)
Potassium: 3.9 mEq/L (ref 3.5–5.1)
Sodium: 142 mEq/L (ref 135–145)
Total Bilirubin: 0.6 mg/dL (ref 0.2–1.2)
Total Protein: 6.8 g/dL (ref 6.0–8.3)

## 2019-04-02 LAB — LIPID PANEL
Cholesterol: 128 mg/dL (ref 0–200)
HDL: 54.1 mg/dL (ref >39.00)
LDL Cholesterol: 63 mg/dL (ref 0–99)
NonHDL: 73.59
Total CHOL/HDL Ratio: 2
Triglycerides: 52 mg/dL (ref 0.0–149.0)
VLDL: 10.4 mg/dL (ref 0.0–40.0)

## 2019-06-04 ENCOUNTER — Other Ambulatory Visit: Payer: Self-pay | Admitting: Cardiovascular Disease

## 2019-06-10 ENCOUNTER — Other Ambulatory Visit: Payer: Self-pay | Admitting: *Deleted

## 2019-06-10 MED ORDER — FLECAINIDE ACETATE 50 MG PO TABS: 50.0000 mg | ORAL_TABLET | Freq: Every day | ORAL | 1 refills | Status: DC

## 2019-06-11 ENCOUNTER — Ambulatory Visit (INDEPENDENT_AMBULATORY_CARE_PROVIDER_SITE_OTHER): Payer: BC Managed Care – PPO | Admitting: Nurse Practitioner

## 2019-06-11 ENCOUNTER — Other Ambulatory Visit: Payer: Self-pay

## 2019-06-11 ENCOUNTER — Encounter: Payer: Self-pay | Admitting: Nurse Practitioner

## 2019-06-11 VITALS — BP 110/70 | HR 88 | Ht 72.0 in | Wt 206.0 lb

## 2019-06-11 DIAGNOSIS — I471 Supraventricular tachycardia: Secondary | ICD-10-CM

## 2019-06-11 MED ORDER — FLECAINIDE ACETATE 50 MG PO TABS: 50.0000 mg | ORAL_TABLET | Freq: Every day | ORAL | 11 refills | Status: DC

## 2019-06-11 NOTE — Patient Instructions (Signed)
Medication Instructions:  Your physician recommends that you continue on your current medications as directed. Please refer to the Current Medication list given to you today.  *If you need a refill on your cardiac medications before your next appointment, please call your pharmacy*  Lab Work: None ordered  If you have labs (blood work) drawn today and your tests are completely normal, you will receive your results only by: Marland Kitchen MyChart Message (if you have MyChart) OR . A paper copy in the mail If you have any lab test that is abnormal or we need to change your treatment, we will call you to review the results.  Testing/Procedures: None ordered  Follow-Up: At [facility], you and your health needs are our priority.  As part of our continuing mission to provide you with exceptional heart care, we have created designated Provider Care Teams.  These Care Teams include your primary Cardiologist (physician) and Advanced Practice Providers (APPs -  Physician Assistants and Nurse Practitioners) who all work together to provide you with the care you need, when you need it.  Your next appointment:   Please make an appt with EP Dr. Caryl Comes

## 2019-06-11 NOTE — Progress Notes (Signed)
Office Visit    Patient Name: Robert Harrell Date of Encounter: 06/11/2019  Primary Care Provider:  [name], NP Primary Cardiologist:  Kathlyn Sacramento, MD  Chief Complaint    38 year old male with a history of PSVT/AVNRT status post unsuccessful catheter ablation 2005, who presents for follow-up of SVT.  Past Medical History    Past Medical History:  Diagnosis Date  . Chickenpox   . 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   Past Surgical History:  Procedure Laterality Date  . ABLATION  2006   Catheter  . VASECTOMY      Allergies  No Known Allergies  History of Present Illness    38 year old male with the above past medical history including PSVT.  He was previously followed by Dr. Maple Hudson in [facility].  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  SVT ablation was carried out however was unsuccessful and he was subsequently placed on flecainide.  He has done well on flecainide therapy noting only 1 or so episode of SVT/tachycardia a year that usually responds to vagal maneuvers.  He was previously followed by B. Atilano Median, MD in [facility], but established care w/ Dr. Fletcher Anon via telemedicine visit in April.  Similar to what he reported in April, he typically experiences an episode of tachypalpitations about once a year.  This typically responds to vagal maneuvers/bearing down.  He says he has not had any recurrent tachypalpitations over this past year.  He overall has done well without symptoms or limitations and tolerates flecainide well.    Home Medications    Prior to Admission medications   Medication Sig Start Date End Date Taking? Authorizing Provider  flecainide (TAMBOCOR) 50 MG tablet Take 1 tablet (50 mg total) by mouth daily.   06/10/19   [name], MD    Review of Systems    He has done well over the past year without recurrent tachypalpitations.  No chest pain, dyspnea, PND, orthopnea, dizziness, syncope, edema, or early satiety.  All other systems reviewed and are otherwise negative except as noted above.  Physical Exam    VS:  BP 110/70 (BP Location: Left Arm, Patient Position: Sitting, Cuff Size: Normal)   Pulse 88   Ht 6' (1.829 m)   Wt 206 lb (93.4 kg)   SpO2 97%   BMI 27.94 kg/m  , BMI Body mass index is 27.94 kg/m. GEN: Well nourished, well developed, in no acute distress. HEENT: normal. Neck: Supple, no JVD, carotid bruits, or masses. Cardiac: RRR, no murmurs, rubs, or gallops. No clubbing, cyanosis, edema.  Radials/DP/PT 2+ and equal bilaterally.  Respiratory:  Respirations regular and unlabored, clear to auscultation bilaterally. GI: Soft, nontender, nondistended, BS + x 4. MS: no deformity or atrophy. Skin: warm and dry, no rash. Neuro:  Strength and sensation are intact. Psych: Normal affect.  Accessory Clinical Findings    ECG personally reviewed by me today -regular sinus rhythm, 88.  He has significant notching of his T waves with what appears to be a blocked PAC versus 2-1 atrial tachycardia.  I did run a rhythm strip and also performed carotid massage and had him bear down.  There was mild slowing of ventricular rate with bearing down though no clear marching out of atrial beats.  Lab Results  Component Value Date   WBC 4.2 01/31/2018   HGB 16.1 01/31/2018   HCT 46.8 01/31/2018   MCV 88.6 01/31/2018   PLT 205.0 01/31/2018   Lab Results  Component Value Date   CREATININE 0.90 04/01/2019   BUN 11 04/01/2019   NA 142 04/01/2019   K 3.9 04/01/2019   CL 106 04/01/2019   CO2 30 04/01/2019   Lab Results  Component Value Date   ALT 20 04/01/2019   AST 20 04/01/2019   ALKPHOS 64 04/01/2019   BILITOT 0.6 04/01/2019   Lab Results  Component Value Date   CHOL 128  04/01/2019   HDL 54.10 04/01/2019   LDLCALC 63 04/01/2019   TRIG 52.0 04/01/2019   CHOLHDL 2 04/01/2019     Assessment & Plan    1.  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  Catheter ablation was unsuccessful and he has been on flecainide since roughly 2005 and is currently taking 50 mg daily.  He was on beta-blocker therapy at one point but this was discontinued secondary to fatigue and reduced sexual drive.  He typically experiences 1 episode of SVT a year which easily breaks with bearing down.  He has not had any in the past year.  We do not have any old ECGs for comparison and his ECG today shows significant notching of the T wave potentially suggestive of an atrial tachycardia or blocked PACs.  I did discuss his case and reviewed his ECG with Drs. Rockey Situ and Lovena Le.  I performed a rhythm strip with carotid massage and subsequently had him bear down to assess for marching out of possible atrial tachycardia.  Though these waves become slightly closer immediately after bearing down, there is no clear marching out.  Dr. Lovena Le recommends watchful waiting, especially given that patient is asymptomatic, with unclear findings, and prior intolerance to beta-blocker.  Patient prefers to limit medications as much as possible due to his prior intolerances.  I will have him follow-up with electrophysiology here in [place] in the future.  2.  Disposition: 40 mins spent in evaluating patient today.  Establish with Dr. Caryl Comes here in [place] going forward.  Murray Hodgkins, NP 06/11/2019, 5:05 PM

## 2019-09-26 ENCOUNTER — Other Ambulatory Visit: Payer: Self-pay

## 2019-09-26 ENCOUNTER — Encounter: Payer: Self-pay | Admitting: Internal Medicine

## 2019-09-26 ENCOUNTER — Ambulatory Visit: Payer: BC Managed Care – PPO | Admitting: Internal Medicine

## 2019-09-26 VITALS — BP 110/84 | HR 84 | Ht 72.0 in | Wt 204.5 lb

## 2019-09-26 NOTE — Progress Notes (Unsigned)
ELECTROPHYSIOLOGY CONSULT NOTE  Patient ID: Robert Harrell, MRN: [id number], DOB/AGE: 37-Sep-1982 37 y.o. Admit date: (Not on file) Date of Consult: 09/26/2019  Primary Physician: [name], NP Primary Cardiologist:       [name] is a 39 y.o. male who is being seen today for the evaluation of *** at the request of ***.    HPI [name] is a 39 y.o. male *** Past Medical History:  Diagnosis Date  . Chickenpox   . 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      Surgical History:  Past Surgical History:  Procedure Laterality Date  . ABLATION  2006   Catheter  . VASECTOMY       Home Meds: No outpatient medications have been marked as taking for the 09/26/19 encounter (Appointment) with [name], MD.    Allergies: No Known Allergies  Social History   Socioeconomic History  . Marital status: Married    Spouse name: Not on file  . Number of children: 2  . Years of education: Not on file  . Highest education level: Not on file  Occupational History  . Occupation: Hotel manager  Tobacco Use  . Smoking status: Never Smoker  . Smokeless tobacco: Never Used  Substance and Sexual Activity  . Alcohol use: Yes    Alcohol/week: 1.0 standard drinks    Types: 1 Standard drinks or equivalent per week    Comment: daily  . Drug use: Never  . Sexual activity: Not on file  Other Topics Concern  . Not on file  Social History Narrative   Married.   2 children.   Works as a Hotel manager for [employer].    Enjoys spending time with family, swimming.    Social Determinants of Health   Financial Resource Strain:   . Difficulty of Paying Living Expenses:   Food Insecurity:   . Worried About Charity fundraiser in the Last Year:   . Arboriculturist in the Last Year:   Transportation Needs:   . Consulting civil engineer (Medical):   Marland Kitchen Lack of Transportation (Non-Medical):   Physical Activity:   . Days of Exercise per Week:   . Minutes of Exercise per Session:   Stress:   . Feeling of Stress :   Social Connections:   . Frequency of Communication with Friends and Family:   . Frequency of Social Gatherings with Friends and Family:   . Attends Religious Services:   . Active Member of Clubs or Organizations:   . Attends Archivist Meetings:   Marland Kitchen Marital Status:   Intimate Partner Violence:   . Fear of Current or Ex-Partner:   . Emotionally Abused:   Marland Kitchen Physically Abused:   . Sexually Abused:      Family History  Problem Relation Age of Onset  . Mental illness Mother   . Mental illness Sister   . Breast cancer Maternal Grandmother   . Heart disease Maternal Grandmother   . Stroke Maternal Grandmother   . Heart disease Maternal Grandfather   . Stroke Maternal Grandfather   . Lung cancer Paternal Grandmother   . Alcohol abuse Paternal Grandfather   . Diabetes Paternal Grandfather      ROS:  Please see the history of present illness.   {ros [id number]}  All other systems reviewed  and negative.    Physical Exam:*** There were no vitals taken for this visit. General: Well developed, well nourished male in no acute distress. Head: Normocephalic, atraumatic, sclera non-icteric, no xanthomas, nares are without discharge. EENT: normal  Lymph Nodes:  none Neck: Negative for carotid bruits. JVD not elevated. Back:without scoliosis kyphosis*** Lungs: Clear bilaterally to auscultation without wheezes, rales, or rhonchi. Breathing is unlabored. Heart: RRR with S1 S2. No *** ***/6 systolic*** murmur . No rubs, or gallops appreciated. Abdomen: Soft, non-tender, non-distended with normoactive bowel sounds. No hepatomegaly. No rebound/guarding. No obvious abdominal masses. Msk:  Strength and tone appear normal for age. Extremities: No clubbing or cyanosis. No*** ***+*** edema.   Distal pedal pulses are 2+ and equal bilaterally. Skin: Warm and Dry Neuro: Alert and oriented X 3. CN III-XII intact Grossly normal sensory and motor function . Psych:  Responds to questions appropriately with a normal affect.      Labs: Cardiac Enzymes No results for input(s): CKTOTAL, CKMB, TROPONINI in the last 72 hours. CBC Lab Results  Component Value Date   WBC 4.2 01/31/2018   HGB 16.1 01/31/2018   HCT 46.8 01/31/2018   MCV 88.6 01/31/2018   PLT 205.0 01/31/2018   PROTIME: No results for input(s): LABPROT, INR in the last 72 hours. Chemistry No results for input(s): NA, K, CL, CO2, BUN, CREATININE, CALCIUM, PROT, BILITOT, ALKPHOS, ALT, AST, GLUCOSE in the last 168 hours.  Invalid input(s): LABALBU Lipids Lab Results  Component Value Date   CHOL 128 04/01/2019   HDL 54.10 04/01/2019   LDLCALC 63 04/01/2019   TRIG 52.0 04/01/2019   BNP No results found for: PROBNP Thyroid Function Tests: No results for input(s): TSH, T4TOTAL, T3FREE, THYROIDAB in the last 72 hours.  Invalid input(s): FREET3 Miscellaneous No results found for: DDIMER  Radiology/Studies:  No results found.  EKG: ***   Assessment and Plan:      SVT  Failed ablation Virl Axe

## 2019-11-14 ENCOUNTER — Encounter: Payer: Self-pay | Admitting: Internal Medicine

## 2019-11-14 ENCOUNTER — Ambulatory Visit (INDEPENDENT_AMBULATORY_CARE_PROVIDER_SITE_OTHER): Payer: BC Managed Care – PPO | Admitting: Internal Medicine

## 2019-11-14 ENCOUNTER — Other Ambulatory Visit: Payer: Self-pay

## 2019-11-14 VITALS — BP 124/80 | HR 90 | Ht 72.0 in | Wt 207.4 lb

## 2019-11-14 DIAGNOSIS — I471 Supraventricular tachycardia: Secondary | ICD-10-CM

## 2019-11-14 MED ORDER — FLECAINIDE ACETATE 50 MG PO TABS: ORAL_TABLET | ORAL | 3 refills | Status: DC

## 2019-11-14 NOTE — Patient Instructions (Signed)

## 2019-11-14 NOTE — Progress Notes (Signed)
ELECTROPHYSIOLOGY CONSULT NOTE  Patient ID: Robert Harrell, MRN: [id number], DOB/AGE: 37-Jul-1982 37 y.o. Admit date: (Not on file) Date of Consult: 11/14/2019  Primary Physician: [name], NP Primary Cardiologist: MA     Robert Harrell is a 39 y.o. male who is being seen today for the evaluation of SVT at the request of MA.    HPI Robert Harrell is a 39 y.o. male with a lifelong history of SVT abrupt in onset and offset, frog negative but diuretic positive.  Has been able to terminate the episodes with Valsalva reliably over the last decade; last emergency room visit was about then.  ECGs are not available.  In 2005 he underwent an EP study, the report of which is not available but the descriptions of which include both AVNRT as well as a concealed retrograde accessory pathway.  It was not successfully ablated.  He was treated with flecainide initially twice daily; now he takes it daily.  He is quite happy with his benefits.  The patient denies chest pain, shortness of breath, nocturnal dyspnea, orthopnea or peripheral edema.  There have been no palpitations, lightheadedness or syncope.     Date Cr K Hgb  10/20 0.9 3.9 16.1            Past Medical History:  Diagnosis Date  . Chickenpox   . 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      Surgical History:  Past Surgical History:  Procedure Laterality Date  . ABLATION  2006   Catheter  . VASECTOMY       Home Meds: Current Meds  Medication Sig  . flecainide (TAMBOCOR) 50 MG tablet Take 1 tablet (50 mg) by mouth once daily  . [DISCONTINUED] flecainide (TAMBOCOR) 50 MG tablet Take 1 tablet (50 mg total) by mouth daily. Please call [phone] to schedule an appointment for further refills. Thank you!    Allergies: No Known Allergies  Social History    Socioeconomic History  . Marital status: Married    Spouse name: Not on file  . Number of children: 2  . Years of education: Not on file  . Highest education level: Not on file  Occupational History  . Occupation: Hotel manager  Tobacco Use  . Smoking status: Never Smoker  . Smokeless tobacco: Never Used  Substance and Sexual Activity  . Alcohol use: Yes    Alcohol/week: 1.0 standard drinks    Types: 1 Standard drinks or equivalent per week    Comment: daily  . Drug use: Never  . Sexual activity: Not on file  Other Topics Concern  . Not on file  Social History Narrative   Married.   2 children.   Works as a Hotel manager for [employer].    Enjoys spending time with family, swimming.    Social Determinants of Health   Financial Resource Strain:   . Difficulty of Paying Living Expenses:   Food Insecurity:   . Worried About Charity fundraiser in the Last Year:   . Arboriculturist in the Last Year:   Transportation Needs:   . Film/video editor (Medical):   Marland Kitchen Lack of Transportation (Non-Medical):   Physical Activity:   . Days of Exercise per Week:   . Minutes of Exercise per Session:   Stress:   . Feeling of Stress :  Social Connections:   . Frequency of Communication with Friends and Family:   . Frequency of Social Gatherings with Friends and Family:   . Attends Religious Services:   . Active Member of Clubs or Organizations:   . Attends Archivist Meetings:   Marland Kitchen Marital Status:   Intimate Partner Violence:   . Fear of Current or Ex-Partner:   . Emotionally Abused:   Marland Kitchen Physically Abused:   . Sexually Abused:      Family History  Problem Relation Age of Onset  . Mental illness Mother   . Mental illness Sister   . Breast cancer Maternal Grandmother   . Heart disease Maternal Grandmother   . Stroke Maternal Grandmother   . Heart disease Maternal Grandfather   . Stroke Maternal Grandfather   . Lung cancer Paternal Grandmother   . Alcohol  abuse Paternal Grandfather   . Diabetes Paternal Grandfather      ROS:  Please see the history of present illness.     All other systems reviewed and negative.    Physical Exam:  Blood pressure 124/80, pulse 90, height 6' (1.829 m), weight 207 lb 6 oz (94.1 kg), SpO2 98 %. General: Well developed, well nourished male in no acute distress. Head: Normocephalic, atraumatic, sclera non-icteric, no xanthomas, nares are without discharge. EENT: normal  Lymph Nodes:  none Neck: Negative for carotid bruits. JVD not elevated. Back:without scoliosis kyphosis Lungs: Clear bilaterally to auscultation without wheezes, rales, or rhonchi. Breathing is unlabored. Heart: RRR with S1 S2. No murmur . No rubs, or gallops appreciated. Abdomen: Soft, non-tender, non-distended with normoactive bowel sounds. No hepatomegaly. No rebound/guarding. No obvious abdominal masses. Msk:  Strength and tone appear normal for age. Extremities: No clubbing or cyanosis. No edema.  Distal pedal pulses are 2+ and equal bilaterally. Skin: Warm and Dry Neuro: Alert and oriented X 3. CN III-XII intact Grossly normal sensory and motor function . Psych:  Responds to questions appropriately with a normal affect.      Labs: Cardiac Enzymes No results for input(s): CKTOTAL, CKMB, TROPONINI in the last 72 hours. CBC Lab Results  Component Value Date   WBC 4.2 01/31/2018   HGB 16.1 01/31/2018   HCT 46.8 01/31/2018   MCV 88.6 01/31/2018   PLT 205.0 01/31/2018   PROTIME: No results for input(s): LABPROT, INR in the last 72 hours. Chemistry No results for input(s): NA, K, CL, CO2, BUN, CREATININE, CALCIUM, PROT, BILITOT, ALKPHOS, ALT, AST, GLUCOSE in the last 168 hours.  Invalid input(s): LABALBU Lipids Lab Results  Component Value Date   CHOL 128 04/01/2019   HDL 54.10 04/01/2019   LDLCALC 63 04/01/2019   TRIG 52.0 04/01/2019   BNP No results found for: PROBNP Thyroid Function Tests: No results for input(s): TSH,  T4TOTAL, T3FREE, THYROIDAB in the last 72 hours.  Invalid input(s): FREET3 Miscellaneous No results found for: DDIMER  Radiology/Studies:  No results found.  EKG: Sinus at 90 Intervals 15/09/37 Nonspecific ST-T changes  Notching of the T waves was quite apparent 12/20   Assessment and Plan:   SVT concealed accessory pathway failed ablation 2005  Abnormal ECG  The patient has SVT.  It is not clear from the notes whether his AV nodal reentry or concealed accessory pathway, but patient initially describing the former with Dr. Atilano Median note describing the latter.  He is on flecainide.  He takes it once daily and is quite satisfied with the result.  He uses adjunctive Valsalva.  Has  not required an ER visit in more than a decade.  We discussed both the flecainide is typically twice daily.  Again, given the paucity of symptoms we will leave it the way it is.  I mentioned the possibility of repeat EP study with some experience like Dr. Elliot Cousin.  He is at this point disinclined to pursue that.  Hard to argue with the paucity of his symptoms.  Notched T waves are some concern; however, in the absence of symptoms and a normal QT interval suspect just normal variant    Robert Harrell

## 2019-12-13 DIAGNOSIS — D229 Melanocytic nevi, unspecified: Secondary | ICD-10-CM

## 2019-12-24 DIAGNOSIS — D224 Melanocytic nevi of scalp and neck: Secondary | ICD-10-CM | POA: Diagnosis not present

## 2020-01-07 DIAGNOSIS — D2262 Melanocytic nevi of left upper limb, including shoulder: Secondary | ICD-10-CM | POA: Diagnosis not present

## 2020-01-07 DIAGNOSIS — D225 Melanocytic nevi of trunk: Secondary | ICD-10-CM | POA: Diagnosis not present

## 2020-01-10 ENCOUNTER — Encounter: Payer: Self-pay | Admitting: Primary Care

## 2020-01-10 ENCOUNTER — Ambulatory Visit: Payer: BC Managed Care – PPO | Admitting: Primary Care

## 2020-01-10 ENCOUNTER — Other Ambulatory Visit: Payer: Self-pay

## 2020-01-10 DIAGNOSIS — R253 Fasciculation: Secondary | ICD-10-CM | POA: Diagnosis not present

## 2020-01-10 NOTE — Progress Notes (Signed)
Subjective:    Patient ID: Robert Harrell, male    DOB: 08/11/1980, 39 y.o.   MRN: [id number]  HPI  This visit occurred during the SARS-CoV-2 public health emergency.  Safety protocols were in place, including screening questions prior to the visit, additional usage of staff PPE, and extensive cleaning of exam room while observing appropriate contact time as indicated for disinfecting solutions.   Robert Harrell is a 39 year old male with a history of paroxysmal SVT managed on flecainide who presents today with a chief complaint of vibration sensation.   He has a constant, pulsating, vibrating sensation to the left plantar foot from the arch and heel that began yesterday morning. He has noticed that the sensation will increase in frequency if his heart rate elevates.   He denies numbness, swelling, chest pain, pain with deep inspiration, palpitations, skin color changes. His father was recently admitted to the hospital for non provoked lower extremity DVT's and pulmonary embolism. No know family history of coagulation disorders.   He was on a five hour plane ride x 2 one week ago. He is non smoker. He is checking his HR with his step tracker which is in the 80's.   BP Readings from Last 3 Encounters:  01/10/20 122/80  11/14/19 124/80  09/26/19 110/84     Review of Systems  Respiratory: Negative for shortness of breath.   Cardiovascular: Negative for chest pain and palpitations.  Skin: Negative for color change.  Neurological: Negative for numbness.       Past Medical History:  Diagnosis Date  . Chickenpox   . 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     Social History   Socioeconomic History  . Marital status: Married    Spouse name: Not on file  . Number of children: 2  . Years of education: Not on file  . Highest education  level: Not on file  Occupational History  . Occupation: Hotel manager  Tobacco Use  . Smoking status: Never Smoker  . Smokeless tobacco: Never Used  Vaping Use  . Vaping Use: Never used  Substance and Sexual Activity  . Alcohol use: Yes    Alcohol/week: 1.0 standard drink    Types: 1 Standard drinks or equivalent per week    Comment: daily  . Drug use: Never  . Sexual activity: Not on file  Other Topics Concern  . Not on file  Social History Narrative   Married.   2 children.   Works as a Hotel manager for [employer].    Enjoys spending time with family, swimming.    Social Determinants of Health   Financial Resource Strain:   . Difficulty of Paying Living Expenses:   Food Insecurity:   . Worried About Charity fundraiser in the Last Year:   . Arboriculturist in the Last Year:   Transportation Needs:   . Film/video editor (Medical):   Marland Kitchen Lack of Transportation (Non-Medical):   Physical Activity:   . Days of Exercise per Week:   . Minutes of Exercise per Session:   Stress:   . Feeling of Stress :   Social Connections:   . Frequency of Communication with Friends and Family:   . Frequency of Social Gatherings with Friends and Family:   . Attends Religious Services:   . Active Member of Clubs or Organizations:   .  Attends Archivist Meetings:   Marland Kitchen Marital Status:   Intimate Partner Violence:   . Fear of Current or Ex-Partner:   . Emotionally Abused:   Marland Kitchen Physically Abused:   . Sexually Abused:     Past Surgical History:  Procedure Laterality Date  . ABLATION  2006   Catheter  . VASECTOMY      Family History  Problem Relation Age of Onset  . Mental illness Mother   . Mental illness Sister   . Breast cancer Maternal Grandmother   . Heart disease Maternal Grandmother   . Stroke Maternal Grandmother   . Heart disease Maternal Grandfather   . Stroke Maternal Grandfather   . Lung cancer Paternal Grandmother   . Alcohol abuse Paternal Grandfather    . Diabetes Paternal Grandfather     No Known Allergies  Current Outpatient Medications on File Prior to Visit  Medication Sig Dispense Refill  . flecainide (TAMBOCOR) 50 MG tablet Take 1 tablet (50 mg) by mouth once daily 90 tablet 3   No current facility-administered medications on file prior to visit.    BP 122/80   Pulse 80   Temp (!) 96.1 F (35.6 C) (Temporal)   Ht 6' (1.829 m)   Wt (!) 207 lb 4 oz (94 kg)   SpO2 98%   BMI 28.11 kg/m    Objective:   Physical Exam Cardiovascular:     Rate and Rhythm: Normal rate and regular rhythm.     Pulses:          Dorsalis pedis pulses are 2+ on the right side and 2+ on the left side.       Posterior tibial pulses are 2+ on the right side and 2+ on the left side.  Pulmonary:     Effort: Pulmonary effort is normal.     Breath sounds: Normal breath sounds.  Musculoskeletal:     Right foot: Normal range of motion.     Left foot: Normal range of motion.  Feet:     Right foot:     Skin integrity: Skin integrity normal. No skin breakdown, erythema or warmth.     Left foot:     Skin integrity: Skin integrity normal. No skin breakdown, erythema or warmth.  Neurological:     Mental Status: He is alert.            Assessment & Plan:

## 2020-01-10 NOTE — Assessment & Plan Note (Signed)
Unclear etiology, I was not able to palpate a fasciculation during exam. Pulses normal. No signs of DVT or other clotting. He does seem nervous about a clot given his fathers recent DVT's and PE's.  Discussed to monitor symptoms, report any changes/progression. He is not in SVT. He will update.

## 2020-01-10 NOTE — Patient Instructions (Signed)
Please monitor your symptoms and update me they persist, progress, etc.  Please notify me if you develop redness, swelling, warmth to the site.  It was a pleasure to see you today!

## 2020-01-14 NOTE — Telephone Encounter (Signed)
FYI

## 2020-01-21 DIAGNOSIS — D2262 Melanocytic nevi of left upper limb, including shoulder: Secondary | ICD-10-CM | POA: Diagnosis not present

## 2020-01-21 DIAGNOSIS — D225 Melanocytic nevi of trunk: Secondary | ICD-10-CM | POA: Diagnosis not present

## 2020-02-04 DIAGNOSIS — D2261 Melanocytic nevi of right upper limb, including shoulder: Secondary | ICD-10-CM | POA: Diagnosis not present

## 2020-02-04 DIAGNOSIS — D225 Melanocytic nevi of trunk: Secondary | ICD-10-CM | POA: Diagnosis not present

## 2020-03-02 DIAGNOSIS — D225 Melanocytic nevi of trunk: Secondary | ICD-10-CM | POA: Diagnosis not present

## 2020-04-08 ENCOUNTER — Telehealth: Payer: Self-pay | Admitting: Primary Care

## 2020-04-08 NOTE — Telephone Encounter (Signed)
PT called in stating that he needs his shot record for a trip and he needs it by Friday. Please Advise

## 2020-04-08 NOTE — Telephone Encounter (Signed)
Called patient have printed off copy of immunizations from [place]. He will come by and pick up.

## 2020-06-24 ENCOUNTER — Encounter: Payer: Self-pay | Admitting: Family Medicine

## 2020-06-24 ENCOUNTER — Telehealth (INDEPENDENT_AMBULATORY_CARE_PROVIDER_SITE_OTHER): Payer: BC Managed Care – PPO | Admitting: Family Medicine

## 2020-06-24 ENCOUNTER — Other Ambulatory Visit: Payer: Self-pay | Admitting: Family Medicine

## 2020-06-24 VITALS — Temp 99.0°F | Ht 72.0 in | Wt 208.0 lb

## 2020-06-24 DIAGNOSIS — R0981 Nasal congestion: Secondary | ICD-10-CM | POA: Diagnosis not present

## 2020-06-24 DIAGNOSIS — Z20822 Contact with and (suspected) exposure to covid-19: Secondary | ICD-10-CM

## 2020-06-24 NOTE — Progress Notes (Signed)
Lab appointment scheduled for Covid Testing on 06/25/2020 at 3:00 pm.  Back door information provided.

## 2020-06-24 NOTE — Progress Notes (Signed)
      Robert Harrell T. Meghin Thivierge, MD Primary Care and Sports Medicine [facility] [address] [place], [address] Phone: [phone]  FAX: [phone]  Robert Harrell - 40 y.o. male  MRN [id number]  Date of Birth: [birth year]  Visit Date: 06/24/2020  PCP: Doreene Nest, NP  Referred by: Doreene Nest, NP  Virtual Visit via Video Note:  I connected with  Robert Harrell on 06/24/2020 11:40 AM EST by a video enabled telemedicine application and verified that I am speaking with the correct person using two identifiers.   Location patient: home computer, tablet, or smartphone Location provider: work or home office Consent: Verbal consent directly obtained from [employer]. Persons participating in the virtual visit: patient, provider  I discussed the limitations of evaluation and management by telemedicine and the availability of in person appointments. The patient expressed understanding and agreed to proceed.  Chief Complaint  Patient presents with  . Covid Positive    +Home Test-Wants PCR test  . Cough  . Nasal Congestion  . Fever    History of Present Illness:  +home covid test  Feels like he has a mild cold.  Fever.  Monday evening.  Tuesday.  He basically does not feel all that bad, but he did start develop some runny nose and some congestion on Monday.  He did do what at home Covid test on Tuesday which was positive for COVID-19.  Globally he does not really feel all that bad and he has only been taking some intermittent ibuprofen.  Review of Systems as above: See pertinent positives and pertinent negatives per HPI No acute distress verbally   Observations/Objective/Exam:  An attempt was made to discern vital signs over the phone and per patient if applicable and possible.   General:    Alert, Oriented, appears well and in no acute distress  Pulmonary:     On inspection no signs of respiratory  distress.  Psych / Neurological:     Pleasant and cooperative.  Assessment and Plan:    ICD-10-CM   1. Suspected COVID-19 virus infection  Z20.822   2. Nasal congestion  R09.81    Positive COVID-19 test at home.  He needs to obtain a PCR for confirmatory or negative status from a household quarantine and school quarantine status.  I discussed the assessment and treatment plan with the patient. The patient was provided an opportunity to ask questions and all were answered. The patient agreed with the plan and demonstrated an understanding of the instructions.   The patient was advised to call back or seek an in-person evaluation if the symptoms worsen or if the condition fails to improve as anticipated.  Follow-up: prn unless noted otherwise below No follow-ups on file.  No orders of the defined types were placed in this encounter.  No orders of the defined types were placed in this encounter.   Signed,  Elpidio Galea. Giovonni Poirier, MD

## 2020-06-24 NOTE — Progress Notes (Signed)
covid 19

## 2020-06-25 ENCOUNTER — Other Ambulatory Visit: Payer: BC Managed Care – PPO

## 2020-06-25 DIAGNOSIS — Z20822 Contact with and (suspected) exposure to covid-19: Secondary | ICD-10-CM

## 2020-06-25 DIAGNOSIS — R0981 Nasal congestion: Secondary | ICD-10-CM | POA: Diagnosis not present

## 2020-06-27 LAB — NOVEL CORONAVIRUS, NAA: SARS-CoV-2, NAA: DETECTED — AB

## 2020-06-27 LAB — SPECIMEN STATUS REPORT

## 2020-06-27 LAB — SARS-COV-2, NAA 2 DAY TAT

## 2020-11-21 ENCOUNTER — Other Ambulatory Visit: Payer: Self-pay | Admitting: Internal Medicine

## 2020-11-23 NOTE — Telephone Encounter (Signed)
Scheduled

## 2020-11-23 NOTE — Telephone Encounter (Signed)
Please schedule overdue F/U appointment with Dr. Caryl Comes. Thank you!

## 2020-12-25 ENCOUNTER — Other Ambulatory Visit: Payer: Self-pay | Admitting: Internal Medicine

## 2021-02-04 ENCOUNTER — Other Ambulatory Visit: Payer: Self-pay

## 2021-02-04 ENCOUNTER — Ambulatory Visit: Payer: BC Managed Care – PPO | Admitting: Internal Medicine

## 2021-02-04 VITALS — BP 142/80 | HR 79 | Ht 72.0 in | Wt 208.0 lb

## 2021-02-04 DIAGNOSIS — I471 Supraventricular tachycardia, unspecified: Secondary | ICD-10-CM

## 2021-02-04 MED ORDER — FLECAINIDE ACETATE 50 MG PO TABS: ORAL_TABLET | ORAL | 3 refills | Status: DC

## 2021-02-04 NOTE — Patient Instructions (Signed)
Medication Instructions:  - Your physician recommends that you continue on your current medications as directed. Please refer to the Current Medication list given to you today.  *If you need a refill on your cardiac medications before your next appointment, please call your pharmacy*   Lab Work: - none ordered  If you have labs (blood work) drawn today and your tests are completely normal, you will receive your results only by: [place] (if you have MyChart) OR A paper copy in the mail If you have any lab test that is abnormal or we need to change your treatment, we will call you to review the results.   Testing/Procedures: - none ordered   Follow-Up: At [facility], you and your health needs are our priority.  As part of our continuing mission to provide you with exceptional heart care, we have created designated Provider Care Teams.  These Care Teams include your primary Cardiologist (physician) and Advanced Practice Providers (APPs -  Physician Assistants and Nurse Practitioners) who all work together to provide you with the care you need, when you need it.  We recommend signing up for the patient portal called "MyChart".  Sign up information is provided on this After Visit Summary.  MyChart is used to connect with patients for Virtual Visits (Telemedicine).  Patients are able to view lab/test results, encounter notes, upcoming appointments, etc.  Non-urgent messages can be sent to your provider as well.   To learn more about what you can do with MyChart, go to [email or web address].    Your next appointment:   2 year(s)  The format for your next appointment:   In Person  Provider:   Virl Axe, MD   Other Instructions N/a

## 2021-02-04 NOTE — Addendum Note (Signed)
Addended by: [name] on: 02/04/2021 12:06 PM   Modules accepted: Level of Service

## 2021-02-04 NOTE — Progress Notes (Addendum)
      Patient Care Team: [name], NP as PCP - General (Internal Medicine) [name], MD as PCP - Cardiology (Cardiology)   HPI  Robert Harrell is a 40 y.o. male Seen in follow-up for SVT for which he underwent catheter ablation in 2005 which failed.  It was unclear from the information as to whether he had AV nodal reentry or anteroseptal accessory pathway.  He has been managed for  on once daily flecainide without recurrences for more than 10 years  At initial visit 5/21 we discussed twice daily flecainide and catheter ablation as alternatives to his current strategy; he was inclined to continue as he was  No interval tachy palpitations.  Fatigue.  Some cognitive issues.  Post COVID.  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    Past Surgical History:  Procedure Laterality Date   ABLATION  2006   Catheter   VASECTOMY      Current Meds  Medication Sig   flecainide (TAMBOCOR) 50 MG tablet TAKE 1 TABLET BY MOUTH EVERY DAY.  Pt must keep appt in Aug for further refills    No Known Allergies    Review of Systems negative except from HPI and PMH  Physical Exam BP (!) 142/80   Pulse 79   Ht 6' (1.829 m)   Wt 208 lb (94.3 kg)   SpO2 98%   BMI 28.21 kg/m  Well developed and well nourished in no acute distress HENT normal E scleral and icterus clear Neck Supple JVP flat; carotids brisk and full Clear to ausculation Regular rate and rhythm, no murmurs gallops or rub Soft with active bowel sounds No clubbing cyanosis  Edema Alert and oriented, grossly normal motor and sensory function Skin Warm and Dry  ECG     CrCl cannot be calculated (Patient's most recent lab result is older than the maximum 21 days allowed.).   Assessment and  Plan SVT concealed accessory pathway failed ablation 2005   Abnormal ECG  Alcohol intake exhuberant    No interval SVT.  Reviewed options including stopping the flecainide, twice daily flecainide and EP study.  He would like to continue taking his flecainide once daily.  We will refill.  Lengthy discussion regarding alcohol and its relation to arrhythmia and depression    Current medicines are reviewed at length with the patient today .  The patient does not  have concerns regarding medicines.

## 2021-02-04 NOTE — Addendum Note (Signed)
Addended byAlvis Lemmings C on: 02/04/2021 11:45 AM   Modules accepted: Orders

## 2021-03-17 ENCOUNTER — Encounter: Payer: Self-pay | Admitting: Primary Care

## 2021-03-24 ENCOUNTER — Other Ambulatory Visit: Payer: Self-pay

## 2021-03-24 ENCOUNTER — Ambulatory Visit (INDEPENDENT_AMBULATORY_CARE_PROVIDER_SITE_OTHER): Payer: BC Managed Care – PPO | Admitting: Primary Care

## 2021-03-24 ENCOUNTER — Encounter: Payer: Self-pay | Admitting: Primary Care

## 2021-03-24 VITALS — BP 130/78 | HR 85 | Temp 97.6°F | Ht 72.0 in | Wt 210.0 lb

## 2021-03-24 DIAGNOSIS — Z114 Encounter for screening for human immunodeficiency virus [HIV]: Secondary | ICD-10-CM | POA: Diagnosis not present

## 2021-03-24 DIAGNOSIS — F419 Anxiety disorder, unspecified: Secondary | ICD-10-CM

## 2021-03-24 DIAGNOSIS — Z23 Encounter for immunization: Secondary | ICD-10-CM | POA: Diagnosis not present

## 2021-03-24 DIAGNOSIS — R6882 Decreased libido: Secondary | ICD-10-CM

## 2021-03-24 DIAGNOSIS — Z0000 Encounter for general adult medical examination without abnormal findings: Secondary | ICD-10-CM

## 2021-03-24 DIAGNOSIS — I471 Supraventricular tachycardia: Secondary | ICD-10-CM

## 2021-03-24 DIAGNOSIS — Z1159 Encounter for screening for other viral diseases: Secondary | ICD-10-CM | POA: Diagnosis not present

## 2021-03-24 DIAGNOSIS — R253 Fasciculation: Secondary | ICD-10-CM

## 2021-03-24 LAB — LIPID PANEL
Cholesterol: 159 mg/dL (ref 0–200)
HDL: 64 mg/dL (ref >39.00)
LDL Cholesterol: 75 mg/dL (ref 0–99)
NonHDL: 94.74
Total CHOL/HDL Ratio: 2
Triglycerides: 100 mg/dL (ref 0.0–149.0)
VLDL: 20 mg/dL (ref 0.0–40.0)

## 2021-03-24 LAB — COMPREHENSIVE METABOLIC PANEL
ALT: 36 U/L (ref 0–53)
AST: 26 U/L (ref 0–37)
Albumin: 4.4 g/dL (ref 3.5–5.2)
Alkaline Phosphatase: 79 U/L (ref 39–117)
BUN: 12 mg/dL (ref 6–23)
CO2: 30 mEq/L (ref 19–32)
Calcium: 10.1 mg/dL (ref 8.4–10.5)
Chloride: 103 mEq/L (ref 96–112)
Creatinine, Ser: 0.98 mg/dL (ref 0.40–1.50)
GFR: 97.65 mL/min (ref >60.00)
Glucose, Bld: 91 mg/dL (ref 70–99)
Potassium: 4.9 mEq/L (ref 3.5–5.1)
Sodium: 140 mEq/L (ref 135–145)
Total Bilirubin: 0.5 mg/dL (ref 0.2–1.2)
Total Protein: 7.1 g/dL (ref 6.0–8.3)

## 2021-03-24 LAB — CBC
HCT: 48.1 % (ref 39.0–52.0)
Hemoglobin: 16.2 g/dL (ref 13.0–17.0)
MCHC: 33.7 g/dL (ref 30.0–36.0)
MCV: 90.7 fl (ref 78.0–100.0)
Platelets: 212 10*3/uL (ref 150.0–400.0)
RBC: 5.3 Mil/uL (ref 4.22–5.81)
RDW: 13 % (ref 11.5–15.5)
WBC: 5 10*3/uL (ref 4.0–10.5)

## 2021-03-24 LAB — TESTOSTERONE: Testosterone: 247.29 ng/dL — ABNORMAL LOW (ref 300.00–890.00)

## 2021-03-24 NOTE — Assessment & Plan Note (Signed)
Influenza vaccine provided today. Other vaccines UTD.  Discussed the importance of a healthy diet and regular exercise in order for weight loss, and to reduce the risk of further co-morbidity.  Recommended to reduce alcohol consumption.  Exam today stable. Labs pending.

## 2021-03-24 NOTE — Assessment & Plan Note (Signed)
Checking testosterone levels. Family history of low testosterone in father.

## 2021-03-24 NOTE — Assessment & Plan Note (Signed)
Following with cardiology, no concerns today. Continue flecainide 50 mg daily.

## 2021-03-24 NOTE — Patient Instructions (Signed)
Stop by the lab prior to leaving today. I will notify you of your results once received.   It was a pleasure to see you today!  Preventive Care 80-38 Years Old, Male Preventive care refers to lifestyle choices and visits with your health care provider that can promote health and wellness. This includes: A yearly physical exam. This is also called an annual wellness visit. Regular dental and eye exams. Immunizations. Screening for certain conditions. Healthy lifestyle choices, such as: Eating a healthy diet. Getting regular exercise. Not using drugs or products that contain nicotine and tobacco. Limiting alcohol use. What can I expect for my preventive care visit? Physical exam Your health care provider may check your: Height and weight. These may be used to calculate your BMI (body mass index). BMI is a measurement that tells if you are at a healthy weight. Heart rate and blood pressure. Body temperature. Skin for abnormal spots. Counseling Your health care provider may ask you questions about your: Past medical problems. Family's medical history. Alcohol, tobacco, and drug use. Emotional well-being. Home life and relationship well-being. Sexual activity. Diet, exercise, and sleep habits. Work and work Statistician. Access to firearms. What immunizations do I need? Vaccines are usually given at various ages, according to a schedule. Your health care provider will recommend vaccines for you based on your age, medical history, and lifestyle or other factors, such as travel or where you work. What tests do I need? Blood tests Lipid and cholesterol levels. These may be checked every 5 years starting at age 43. Hepatitis C test. Hepatitis B test. Screening  Diabetes screening. This is done by checking your blood sugar (glucose) after you have not eaten for a while (fasting). Genital exam to check for testicular cancer or hernias. STD (sexually transmitted disease) testing, if  you are at risk. Talk with your health care provider about your test results, treatment options, and if necessary, the need for more tests. Follow these instructions at home: Eating and drinking  Eat a healthy diet that includes fresh fruits and vegetables, whole grains, lean protein, and low-fat dairy products. Drink enough fluid to keep your urine pale yellow. Take vitamin and mineral supplements as recommended by your health care provider. Do not drink alcohol if your health care provider tells you not to drink. If you drink alcohol: Limit how much you have to 0-2 drinks a day. Be aware of how much alcohol is in your drink. In the [place] one drink equals one 12 oz bottle of beer (355 mL), one 5 oz glass of wine (148 mL), or one 1 oz glass of hard liquor (44 mL). Lifestyle Take daily care of your teeth and gums. Brush your teeth every morning and night with fluoride toothpaste. Floss one time each day. Stay active. Exercise for at least 30 minutes 5 or more days each week. Do not use any products that contain nicotine or tobacco, such as cigarettes, e-cigarettes, and chewing tobacco. If you need help quitting, ask your health care provider. Do not use drugs. If you are sexually active, practice safe sex. Use a condom or other form of protection to prevent STIs (sexually transmitted infections). Find healthy ways to cope with stress, such as: Meditation, yoga, or listening to music. Journaling. Talking to a trusted person. Spending time with friends and family. Safety Always wear your seat belt while driving or riding in a vehicle. Do not drive: If you have been drinking alcohol. Do not ride with someone who has been  drinking. When you are tired or distracted. While texting. Wear a helmet and other protective equipment during sports activities. If you have firearms in your house, make sure you follow all gun safety procedures. Seek help if you have been physically or sexually  abused. What's next? Go to your health care provider once a year for an annual wellness visit. Ask your health care provider how often you should have your eyes and teeth checked. Stay up to date on all vaccines. This information is not intended to replace advice given to you by your health care provider. Make sure you discuss any questions you have with your health care provider. Document Revised: 08/14/2020 Document Reviewed: 05/31/2018 Elsevier Patient Education  2022 [employer].

## 2021-03-24 NOTE — Progress Notes (Signed)
Subjective:    Patient ID: Robert Harrell, male    DOB: 04-Feb-1981, 40 y.o.   MRN: [id number]  HPI  Robert Harrell is a very pleasant 40 y.o. male who presents today for complete physical and follow up of chronic conditions.  He would also like his testosterone checked due to low sexual desire and daytime tiredness. A few times weekly he's noticed having to take a break at work so that he can take a nap. Works from home. He's also noticed that he's slower to react to things, was able to respond quicker to problems with work. He has a family history of low testosterone level in his father. His father mentions that he had these same symptoms when he was his son's age, and symptoms were secondary to low testosterone. He denies snoring during the night. He is drinking 2-3 shots of alcohol daily to help reduce anxiety.   Immunizations: -Tetanus: 2018 -Influenza: Due today -Covid-19: 3 vaccines   Diet: Fair diet.  Exercise: No regular exercise.  Eye exam: Completed in 2022.  Dental exam: Completes semi-annually   BP Readings from Last 3 Encounters:  03/24/21 130/78  02/04/21 (!) 142/80  01/10/20 122/80       Review of Systems  Constitutional:  Negative for unexpected weight change.  HENT:  Negative for rhinorrhea.   Respiratory:  Negative for shortness of breath.   Cardiovascular:  Negative for chest pain.  Gastrointestinal:  Negative for constipation and diarrhea.  Genitourinary:  Negative for difficulty urinating.  Musculoskeletal:  Negative for arthralgias and myalgias.  Skin:  Negative for rash.  Allergic/Immunologic: Negative for environmental allergies.  Neurological:  Negative for dizziness, numbness and headaches.  Psychiatric/Behavioral:  The patient is nervous/anxious.         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    Social History   Socioeconomic History   Marital status: Married    Spouse name: Not on file   Number of children: 2   Years of education: Not on file   Highest education level: Not on file  Occupational History   Occupation: Hotel manager  Tobacco Use   Smoking status: Never   Smokeless tobacco: Never  Vaping Use   Vaping Use: Never used  Substance and Sexual Activity   Alcohol use: Yes    Alcohol/week: 1.0 standard drink    Types: 1 Standard drinks or equivalent per week    Comment: daily   Drug use: Never   Sexual activity: Not on file  Other Topics Concern   Not on file  Social History Narrative   Married.   2 children.   Works as a Hotel manager for [employer].    Enjoys spending time with family, swimming.    Social Determinants of Health   Financial Resource Strain: Not on file  Food Insecurity: Not on file  Transportation Needs: Not on file  Physical Activity: Not on file  Stress: Not on file  Social Connections: Not on file  Intimate Partner Violence: Not on file    Past Surgical History:  Procedure Laterality Date   ABLATION  2006   Catheter   VASECTOMY      Family History  Problem Relation Age of Onset   Mental illness Mother    Mental illness Sister  Breast cancer Maternal Grandmother    Heart disease Maternal Grandmother    Stroke Maternal Grandmother    Heart disease Maternal Grandfather    Stroke Maternal Grandfather    Lung cancer Paternal Grandmother    Alcohol abuse Paternal Grandfather    Diabetes Paternal Grandfather     No Known Allergies  Current Outpatient Medications on File Prior to Visit  Medication Sig Dispense Refill   flecainide (TAMBOCOR) 50 MG tablet TAKE 1 TABLET BY MOUTH EVERY DAY.  Pt must keep appt in Aug for further refills 90 tablet 3   No current facility-administered medications on file prior to visit.    BP 130/78   Pulse 85   Temp 97.6 F  (36.4 C) (Temporal)   Ht 6' (1.829 m)   Wt 210 lb (95.3 kg)   SpO2 97%   BMI 28.48 kg/m  Objective:   Physical Exam HENT:     Right Ear: Tympanic membrane and ear canal normal.     Left Ear: Tympanic membrane and ear canal normal.     Nose: Nose normal.     Right Sinus: No maxillary sinus tenderness or frontal sinus tenderness.     Left Sinus: No maxillary sinus tenderness or frontal sinus tenderness.  Eyes:     Conjunctiva/sclera: Conjunctivae normal.  Neck:     Thyroid: No thyromegaly.     Vascular: No carotid bruit.  Cardiovascular:     Rate and Rhythm: Normal rate and regular rhythm.     Heart sounds: Normal heart sounds.  Pulmonary:     Effort: Pulmonary effort is normal.     Breath sounds: Normal breath sounds. No wheezing or rales.  Abdominal:     General: Bowel sounds are normal.     Palpations: Abdomen is soft.     Tenderness: There is no abdominal tenderness.  Musculoskeletal:        General: Normal range of motion.     Cervical back: Neck supple.  Skin:    General: Skin is warm and dry.  Neurological:     Mental Status: He is alert and oriented to person, place, and time.     Cranial Nerves: No cranial nerve deficit.     Deep Tendon Reflexes: Reflexes are normal and symmetric.  Psychiatric:        Mood and Affect: Mood normal.          Assessment & Plan:      This visit occurred during the SARS-CoV-2 public health emergency.  Safety protocols were in place, including screening questions prior to the visit, additional usage of staff PPE, and extensive cleaning of exam room while observing appropriate contact time as indicated for disinfecting solutions.

## 2021-03-24 NOTE — Assessment & Plan Note (Signed)
No concerns today, all symptoms resolved.

## 2021-03-24 NOTE — Assessment & Plan Note (Signed)
Mentions this briefly today during visit, didn't want to discuss further. Recommended he reduce alcohol consumption. I also notified him that I am here to discuss anxiety whenever he is ready.

## 2021-03-25 LAB — HIV ANTIBODY (ROUTINE TESTING W REFLEX): HIV 1&2 Ab, 4th Generation: NONREACTIVE

## 2021-03-25 LAB — HEPATITIS C ANTIBODY
Hepatitis C Ab: NONREACTIVE
SIGNAL TO CUT-OFF: 0.01 (ref <1.00)

## 2021-03-27 DIAGNOSIS — E349 Endocrine disorder, unspecified: Secondary | ICD-10-CM

## 2021-03-27 DIAGNOSIS — R6882 Decreased libido: Secondary | ICD-10-CM

## 2021-03-30 DIAGNOSIS — Z23 Encounter for immunization: Secondary | ICD-10-CM

## 2021-03-30 DIAGNOSIS — Z0000 Encounter for general adult medical examination without abnormal findings: Secondary | ICD-10-CM | POA: Diagnosis not present

## 2021-04-14 ENCOUNTER — Ambulatory Visit: Payer: Managed Care, Other (non HMO) | Admitting: Urology

## 2021-04-14 ENCOUNTER — Encounter: Payer: Self-pay | Admitting: Urology

## 2021-04-14 ENCOUNTER — Other Ambulatory Visit: Payer: Self-pay

## 2021-04-14 VITALS — BP 144/89 | HR 87 | Ht 72.0 in | Wt 207.0 lb

## 2021-04-14 DIAGNOSIS — E291 Testicular hypofunction: Secondary | ICD-10-CM | POA: Diagnosis not present

## 2021-04-14 DIAGNOSIS — E349 Endocrine disorder, unspecified: Secondary | ICD-10-CM | POA: Diagnosis not present

## 2021-04-14 NOTE — Progress Notes (Signed)
04/14/2021 9:04 AM   Robert Harrell Mar 30, 1981 [id number]  Referring provider: [name], NP [address],  [place] [address]  Chief Complaint  Patient presents with   Hypogonadism    HPI: Robert Harrell is a 40 y.o. male referred for evaluation of hypogonadism.  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    Surgical History: Past Surgical History:  Procedure Laterality Date   ABLATION  2006   Catheter   VASECTOMY      Home Medications:  Allergies as of 04/14/2021   No Known Allergies      Medication List        Accurate as of April 14, 2021  9:04 AM. If you have any questions, ask your nurse or doctor.          flecainide 50 MG tablet Commonly known as: TAMBOCOR TAKE 1 TABLET BY MOUTH EVERY DAY.  Pt must keep appt in Aug for further refills        Allergies: No Known Allergies  Family History: Family History  Problem Relation Age of Onset   Mental illness Mother    Mental illness Sister    Breast cancer Maternal Grandmother    Heart disease Maternal Grandmother    Stroke Maternal Grandmother    Heart disease Maternal Grandfather    Stroke Maternal Grandfather    Lung cancer Paternal Grandmother    Alcohol abuse Paternal Grandfather    Diabetes Paternal Grandfather     Social History:  reports that he has never smoked. He has  never used smokeless tobacco. He reports current alcohol use of about 1.0 standard drink per week. He reports that he does not use drugs.   Physical Exam: BP (!) 144/89   Pulse 87   Ht 6' (1.829 m)   Wt 207 lb (93.9 kg)   BMI 28.07 kg/m   Constitutional:  Alert and oriented, No acute distress. HEENT: [place] AT, moist mucus membranes.  Trachea midline, no masses. Cardiovascular: No clubbing, cyanosis, or edema. Respiratory: Normal respiratory effort, no increased work of breathing. GI: Abdomen is soft, nontender, nondistended, no abdominal masses GU: Phallus uncircumcised without lesions, testes descended bilaterally with estimated volume 15-20 cc.  Spermatic cord/epididymis palpably normal bilaterally Psychiatric: Normal mood and affect.   Assessment & Plan:    1.  Hypogonadism He has had 1 low testosterone level Repeat a.m. testosterone level today along with an LH and prolactin If no pituitary abnormalities are noted we discussed the most common forms of TRT being topical gels and intramuscular injection.  Other available options were discussed including subcutaneous testosterone, Testopel and oral testosterone The off label use of Clomid was also discussed. Potential side effects of testosterone replacement were discussed including stimulation of benign prostatic growth with lower urinary tract symptoms; erythrocytosis; edema; gynecomastia; worsening sleep apnea; venous thromboembolism; testicular atrophy and infertility. Recent studies suggesting an increased incidence of heart attack and  stroke in patients taking testosterone was discussed. He was informed there is conflicting evidence regarding the impact of testosterone therapy on cardiovascular risk. The theoretical risk of growth stimulation of an undetected prostate cancer was also discussed.  He was informed that current evidence does not provide any definitive answers regarding the risks of testosterone therapy on prostate cancer  and cardiovascular disease. The need for periodic monitoring of his testosterone level, PSA, hematocrit and DRE was discussed. He will be notified with his repeat blood work and in the meantime will think over these replacement options    Abbie Sons, [place] [address] [place], [place] [address] [phone]

## 2021-04-15 ENCOUNTER — Encounter: Payer: Self-pay | Admitting: *Deleted

## 2021-04-15 ENCOUNTER — Telehealth: Payer: Self-pay | Admitting: Urology

## 2021-04-15 LAB — TESTOSTERONE: Testosterone: 217 ng/dL — ABNORMAL LOW (ref 264–916)

## 2021-04-15 LAB — LUTEINIZING HORMONE: LH: 6.3 m[IU]/mL (ref 1.7–8.6)

## 2021-04-15 LAB — PROLACTIN: Prolactin: 9.1 ng/mL (ref 4.0–15.2)

## 2021-04-15 LAB — PSA: Prostate Specific Ag, Serum: 0.8 ng/mL (ref 0.0–4.0)

## 2021-04-15 NOTE — Telephone Encounter (Signed)
Repeat testosterone level remains low at 217.  LH and prolactin levels were in the normal range.  We discussed options yesterday and he was going to think over but did express interest in a trial of Clomid.  Let me know and I will send in Rx if that is his choice preference

## 2021-04-19 ENCOUNTER — Encounter: Payer: Self-pay | Admitting: *Deleted

## 2021-04-21 ENCOUNTER — Other Ambulatory Visit: Payer: Self-pay | Admitting: Urology

## 2021-04-21 MED ORDER — CLOMIPHENE CITRATE 50 MG PO TABS: 25.0000 mg | ORAL_TABLET | Freq: Every day | ORAL | 0 refills | Status: DC

## 2021-06-03 ENCOUNTER — Other Ambulatory Visit: Payer: BC Managed Care – PPO

## 2021-06-03 ENCOUNTER — Other Ambulatory Visit: Payer: Self-pay | Admitting: Family Medicine

## 2021-06-04 ENCOUNTER — Encounter: Payer: Self-pay | Admitting: Urology

## 2021-06-04 ENCOUNTER — Ambulatory Visit: Payer: BC Managed Care – PPO | Admitting: Urology

## 2021-06-04 ENCOUNTER — Other Ambulatory Visit: Payer: Self-pay

## 2021-06-04 VITALS — BP 148/98 | HR 96 | Ht 72.0 in | Wt 206.0 lb

## 2021-06-04 DIAGNOSIS — E291 Testicular hypofunction: Secondary | ICD-10-CM

## 2021-06-04 NOTE — Progress Notes (Signed)
06/04/2021 1:33 PM   Robert Harrell 04/03/1981 [id number]  Referring provider: [name], NP [place],  [place] [address]  Chief Complaint  Patient presents with   Hypogonadism    HPI: 40 y.o. male presents for follow-up visit.  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    Surgical History: Past Surgical History:  Procedure Laterality Date   ABLATION  2006   Catheter   VASECTOMY      Home Medications:  Allergies as of 06/04/2021   No Known Allergies      Medication List        Accurate as of June 04, 2021  1:33 PM. If you have any questions, ask your nurse or doctor.          clomiPHENE 50 MG tablet Commonly known as: CLOMID Take 0.5 tablets (25 mg total) by mouth daily.   flecainide 50 MG tablet Commonly known as: TAMBOCOR TAKE 1 TABLET BY MOUTH EVERY DAY.  Pt must keep appt in Aug for further refills        Allergies: No Known Allergies  Family History: Family History  Problem Relation Age of Onset   Mental illness Mother    Mental illness Sister    Breast cancer Maternal Grandmother    Heart disease Maternal Grandmother    Stroke Maternal Grandmother    Heart disease Maternal Grandfather    Stroke Maternal Grandfather    Lung cancer Paternal Grandmother    Alcohol abuse Paternal Grandfather    Diabetes Paternal Grandfather     Social History:   reports that he has never smoked. He has never used smokeless tobacco. He reports current alcohol use of about 1.0 standard drink per week. He reports that he does not use drugs.   Physical Exam: BP (!) 148/98    Pulse 96    Ht 6' (1.829 m)    Wt 206 lb (93.4 kg)    BMI 27.94 kg/m   Constitutional:  Alert and oriented, No acute distress. HEENT:  AT, moist mucus membranes.  Trachea midline, no masses. Cardiovascular: No clubbing, cyanosis, or edema. Respiratory: Normal respiratory effort, no increased work of breathing. Psychiatric: Normal mood and affect.   Assessment & Plan:    1. Hypogonadism in male He has had a prior vasectomy and fertility is not a concern We discussed TRT and various delivery methods including topical testosterone, IM injections, Xyosted, Testopel and oral testosterone We also discussed the availability of oral testosterone troches through a compounding pharmacy Cost is his main concern.  We discussed the cost of the compounded product Will send in Rx for injectable testosterone for prior authorization and he will decide which method he desires based on cost Potential side effects of testosterone replacement were discussed including stimulation of benign prostatic growth with lower urinary tract symptoms; erythrocytosis; edema; gynecomastia; worsening sleep apnea; venous thromboembolism; testicular atrophy and infertility. Recent  studies suggesting an increased incidence of heart attack and stroke in patients taking testosterone was discussed. He was informed there is conflicting evidence regarding the impact of testosterone therapy on cardiovascular risk. The theoretical risk of growth stimulation of an undetected prostate cancer was also discussed.  He was informed that current evidence does not provide any definitive answers regarding the risks of testosterone therapy on prostate cancer and cardiovascular disease. The need for periodic monitoring of his testosterone  level, PSA, hematocrit and DRE was discussed.   Abbie Sons, [place] [address] [place], [place] [address] [phone]

## 2021-06-05 LAB — TESTOSTERONE: Testosterone: 653 ng/dL (ref 264–916)

## 2021-06-06 ENCOUNTER — Encounter: Payer: Self-pay | Admitting: Urology

## 2021-06-06 MED ORDER — TESTOSTERONE CYPIONATE 200 MG/ML IM SOLN: 200.0000 mg | INTRAMUSCULAR | 0 refills | Status: DC

## 2021-06-07 ENCOUNTER — Encounter: Payer: Self-pay | Admitting: *Deleted

## 2021-07-08 ENCOUNTER — Other Ambulatory Visit: Payer: Self-pay

## 2021-07-08 ENCOUNTER — Ambulatory Visit (INDEPENDENT_AMBULATORY_CARE_PROVIDER_SITE_OTHER)
Admission: RE | Admit: 2021-07-08 | Discharge: 2021-07-08 | Disposition: A | Discharge status: Home / Self Care | Payer: BC Managed Care – PPO | Source: Ambulatory Visit | Attending: Primary Care | Admitting: Primary Care

## 2021-07-08 ENCOUNTER — Ambulatory Visit: Payer: BC Managed Care – PPO | Admitting: Primary Care

## 2021-07-08 VITALS — BP 118/82 | HR 77 | Temp 97.5°F | Ht 72.0 in | Wt 214.4 lb

## 2021-07-08 DIAGNOSIS — R053 Chronic cough: Secondary | ICD-10-CM | POA: Diagnosis not present

## 2021-07-08 DIAGNOSIS — R509 Fever, unspecified: Secondary | ICD-10-CM | POA: Diagnosis not present

## 2021-07-08 MED ORDER — OMEPRAZOLE 40 MG PO CPDR: 40.0000 mg | DELAYED_RELEASE_CAPSULE | Freq: Every day | ORAL | 0 refills | Status: DC

## 2021-07-08 MED ORDER — BENZONATATE 200 MG PO CAPS: 200.0000 mg | ORAL_CAPSULE | Freq: Three times a day (TID) | ORAL | 0 refills | Status: DC | PRN

## 2021-07-08 MED ORDER — FLUTICASONE PROPIONATE 50 MCG/ACT NA SUSP: 1.0000 | Freq: Two times a day (BID) | NASAL | 0 refills | Status: DC

## 2021-07-08 NOTE — Assessment & Plan Note (Signed)
Question post viral cough if he did have Covid-19 a few months ago.  Could also be allergies vs GERD, or both.   Chest xray pending today.   Rx for Flonase BID, Tessalon Perles, and omeprazole 40 mg sent to pharmacy. Discussed to resume Claritin and take daily.  He will update. Await chest xray results.

## 2021-07-08 NOTE — Progress Notes (Signed)
Subjective:    Patient ID: Robert Harrell, male    DOB: 03/03/1981, 41 y.o.   MRN: [id number]  HPI  Robert Harrell is a very pleasant 41 y.o. male with a history of paroxysmal atrial fibrillation, anxiety, testosterone deficiency who presents today to discuss cough.  His cough began two months ago, occurs intermittently throughout the day with sore throat, sinus congestion, post nasal drip, low grade fevers ranging 99-100.   His cough is a "tickle" that he feels deep within the throat, mostly irrigated at night when trying to go to sleep. Cough is also worse when laying down. His sleep has been inconsistent.   He's been taking OTC cough medications and lozenges without improvement. He took Claritin for 1 day, no improvement.   He does experience esophageal burning when eating anything with onions. This occurs 1-2 times weekly.   He denies a history of asthma or allergies. He is a non smoker. He may have had Covid-19 infection in late November 2022, he never tested for Covid at the time.   Review of Systems  Constitutional:  Positive for fever. Negative for chills.  HENT:  Positive for postnasal drip and sinus pressure. Negative for ear pain.   Respiratory:  Positive for cough. Negative for shortness of breath and wheezing.         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    Social History   Socioeconomic History   Marital status: Married    Spouse name: Not on file   Number of children: 2   Years of education: Not on file   Highest education level: Not on file  Occupational History   Occupation: Hotel manager  Tobacco Use   Smoking status: Never   Smokeless tobacco: Never  Vaping Use   Vaping Use: Never used  Substance and Sexual Activity   Alcohol use: Yes     Alcohol/week: 1.0 standard drink    Types: 1 Standard drinks or equivalent per week    Comment: daily   Drug use: Never   Sexual activity: Not on file  Other Topics Concern   Not on file  Social History Narrative   Married.   2 children.   Works as a Hotel manager for [employer].    Enjoys spending time with family, swimming.    Social Determinants of Health   Financial Resource Strain: Not on file  Food Insecurity: Not on file  Transportation Needs: Not on file  Physical Activity: Not on file  Stress: Not on file  Social Connections: Not on file  Intimate Partner Violence: Not on file    Past Surgical History:  Procedure Laterality Date   ABLATION  2006   Catheter   VASECTOMY      Family History  Problem Relation Age of Onset   Mental illness Mother    Mental illness Sister    Breast cancer Maternal Grandmother    Heart disease Maternal Grandmother    Stroke Maternal Grandmother    Heart disease Maternal Grandfather    Stroke Maternal Grandfather    Lung cancer Paternal Grandmother    Alcohol abuse Paternal Grandfather    Diabetes Paternal Grandfather     No Known Allergies  Current Outpatient Medications on File Prior to Visit  Medication Sig Dispense Refill   flecainide (  TAMBOCOR) 50 MG tablet TAKE 1 TABLET BY MOUTH EVERY DAY.  Pt must keep appt in Aug for further refills 90 tablet 3   testosterone cypionate (DEPOTESTOSTERONE CYPIONATE) 200 MG/ML injection Inject 1 mL (200 mg total) into the muscle every 14 (fourteen) days. (Patient not taking: Reported on 07/08/2021) 4 mL 0   No current facility-administered medications on file prior to visit.    BP 118/82    Pulse 77    Temp (!) 97.5 F (36.4 C) (Temporal)    Ht 6' (1.829 m)    Wt 214 lb 7 oz (97.3 kg)    SpO2 96%    BMI 29.08 kg/m  Objective:   Physical Exam Constitutional:      Appearance: He is not ill-appearing.  HENT:     Right Ear: Tympanic membrane and ear canal normal. Tympanic membrane is not  erythematous.     Left Ear: Ear canal normal. Tympanic membrane is not erythematous.     Ears:     Comments: Fluid noted behind left TM. Cardiovascular:     Rate and Rhythm: Normal rate and regular rhythm.  Pulmonary:     Effort: Pulmonary effort is normal.     Breath sounds: Examination of the left-lower field reveals rhonchi. Rhonchi present.     Comments: Congested sounding cough during visit Neurological:     Mental Status: He is alert.          Assessment & Plan:      This visit occurred during the SARS-CoV-2 public health emergency.  Safety protocols were in place, including screening questions prior to the visit, additional usage of staff PPE, and extensive cleaning of exam room while observing appropriate contact time as indicated for disinfecting solutions.

## 2021-07-08 NOTE — Patient Instructions (Addendum)
Start Claritin everyday for allergies.  You may take Benzonatate capsules for cough. Take 1 capsule by mouth three times daily as needed for cough.  Start omeprazole 40 mg for cough and heartburn. Take this every night at bedtime.   Nasal Congestion/Ear Pressure/Sinus Pressure: Try using Flonase (fluticasone) nasal spray. Instill 1 spray in each nostril twice daily.   I'll be in touch again soon!

## 2021-08-07 ENCOUNTER — Other Ambulatory Visit: Payer: Self-pay | Admitting: Primary Care

## 2021-08-07 DIAGNOSIS — R053 Chronic cough: Secondary | ICD-10-CM

## 2021-08-09 NOTE — Telephone Encounter (Signed)
How's he doing since the last time he visited? How's his cough?  Is he still taking the omeprazole 40 mg daily?  Received refill request.

## 2021-08-10 NOTE — Telephone Encounter (Signed)
Patient cough has gone away and had been taking omeprazole daily would like refill.

## 2021-08-11 NOTE — Telephone Encounter (Signed)
Please thank him for the update, glad to know that his cough has abated.  I will send another 30-day supply of the omeprazole 40 mg.  Once he has completed the 40 mg dose, I recommend he transition down to the omeprazole 20 mg dose for which he can obtain over-the-counter.  The plan is for him to wean off completely, but he has to do this slowly.  Have him send me a message via MyChart if he has any questions or problems weaning down.

## 2021-08-16 NOTE — Telephone Encounter (Signed)
lmtcb

## 2021-08-16 NOTE — Telephone Encounter (Signed)
Spoke with patient about message from [facility] about mediations. Patient verbalized understanding.

## 2021-09-09 ENCOUNTER — Other Ambulatory Visit: Payer: Self-pay | Admitting: Primary Care

## 2021-09-09 DIAGNOSIS — R053 Chronic cough: Secondary | ICD-10-CM

## 2021-09-23 ENCOUNTER — Other Ambulatory Visit: Payer: Self-pay | Admitting: Primary Care

## 2021-09-23 DIAGNOSIS — R053 Chronic cough: Secondary | ICD-10-CM

## 2022-01-06 ENCOUNTER — Other Ambulatory Visit: Payer: Self-pay | Admitting: Internal Medicine

## 2022-02-08 ENCOUNTER — Other Ambulatory Visit: Payer: Self-pay | Admitting: Internal Medicine

## 2022-03-02 ENCOUNTER — Other Ambulatory Visit: Payer: Self-pay | Admitting: Internal Medicine

## 2022-03-21 ENCOUNTER — Other Ambulatory Visit: Payer: Self-pay | Admitting: Internal Medicine

## 2022-04-01 ENCOUNTER — Other Ambulatory Visit: Payer: Self-pay | Admitting: Primary Care

## 2022-04-01 ENCOUNTER — Telehealth: Payer: Self-pay | Admitting: Internal Medicine

## 2022-04-01 DIAGNOSIS — R053 Chronic cough: Secondary | ICD-10-CM

## 2022-04-01 NOTE — Telephone Encounter (Signed)
Called [facility] pharmacy inquiring about prescription that was sent 03/23/2022. Pharmacy staff states that they did receive the prescription electronically and will fill the prescription. Called the patient stating that he should get notification when the medication is ready for pickup.

## 2022-04-01 NOTE — Telephone Encounter (Signed)
*  STAT* If patient is at the pharmacy, call can be transferred to refill team.   1. Which medications need to be refilled? (please list name of each medication and dose if known) flecainide (TAMBOCOR) 50 MG tablet  2. Which pharmacy/location (including street and city if local pharmacy) is medication to be sent to? [id number] [place] [place] - [address]. Do they need a 30 day or 90 day supply? 90 day supply   Pharmacy advised him they have not received prescription.

## 2022-04-29 ENCOUNTER — Ambulatory Visit (INDEPENDENT_AMBULATORY_CARE_PROVIDER_SITE_OTHER): Payer: 59 | Admitting: Family Medicine

## 2022-04-29 VITALS — BP 118/60 | HR 77 | Temp 98.2°F | Ht 72.0 in | Wt 215.0 lb

## 2022-04-29 DIAGNOSIS — J029 Acute pharyngitis, unspecified: Secondary | ICD-10-CM

## 2022-04-29 DIAGNOSIS — J020 Streptococcal pharyngitis: Secondary | ICD-10-CM

## 2022-04-29 LAB — POCT RAPID STREP A (OFFICE): Rapid Strep A Screen: POSITIVE — AB

## 2022-04-29 MED ORDER — AMOXICILLIN 500 MG PO CAPS: 500.0000 mg | ORAL_CAPSULE | Freq: Two times a day (BID) | ORAL | 0 refills | Status: AC

## 2022-04-29 NOTE — Progress Notes (Signed)
Subjective:  Patient ID: Robert Harrell, male    DOB: [birth year]  Age: 41 y.o. MRN: [id number]  CC:  Chief Complaint  Patient presents with   Sore Throat    Sore throat, slight ear ache, notes his children had strep last week and his sxs started shortly after     HPI Robert Harrell presents for   Sore throat Started with sore throat about 6 days ago. No fever. Persistent sore throat past week, feels worse - moving with itch feeling to chest. Feeling of need to cough, no productive cough.  R ear pain, ringing in feeling this am. Hearing ok.  Children had strep throat last week, his symptoms started soon after the ear infection.  Tx: cough drops.    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   Past Surgical History:  Procedure Laterality Date   ABLATION  2006   Catheter   VASECTOMY     No Known Allergies Prior to Admission medications   Medication Sig Start Date End Date Taking? Authorizing Provider  flecainide (TAMBOCOR) 50 MG tablet TAKE 1 TABLET BY MOUTH EVERY DAY. PT MUST KEEP APPT IN AUG FOR FURTHER REFILLS 03/23/22  Yes [name], MD  benzonatate (TESSALON) 200 MG capsule Take 1 capsule (200 mg total) by mouth 3 (three) times daily as needed for cough. 07/08/21   [name], NP  fluticasone (FLONASE) 50 MCG/ACT nasal spray PLACE 1 SPRAY INTO BOTH NOSTRILS 2 (TWO) TIMES DAILY 09/23/21   [name], NP  omeprazole (PRILOSEC) 40 MG capsule TAKE 1 CAPSULE (40 MG TOTAL)  BY MOUTH DAILY. FOR COUGH AND HEARTBURN 08/11/21   [name], NP  testosterone cypionate (DEPOTESTOSTERONE CYPIONATE) 200 MG/ML injection Inject 1 mL (200 mg total) into the muscle every 14 (fourteen) days. Patient not taking: Reported on 07/08/2021 06/06/21   Abbie Sons, MD   Social History   Socioeconomic History   Marital status: Married    Spouse name: Not on file   Number of children: 2   Years of education: Not on file   Highest education level: Not on file  Occupational History   Occupation: Hotel manager  Tobacco Use   Smoking status: Never   Smokeless tobacco: Never  Vaping Use   Vaping Use: Never used  Substance and Sexual Activity   Alcohol use: Yes    Alcohol/week: 1.0 standard drink of alcohol    Types: 1 Standard drinks or equivalent per week    Comment: daily   Drug use: Never   Sexual activity: Not on file  Other Topics Concern   Not on file  Social History Narrative   Married.   2 children.   Works as a Hotel manager for [employer].    Enjoys spending time with family, swimming.    Social Determinants of Health   Financial Resource Strain: Not on file  Food Insecurity: Not on file  Transportation Needs: Not on file  Physical Activity: Not on file  Stress: Not on file  Social Connections: Not on file  Intimate Partner Violence: Not on file    Review of Systems Per HPI   Objective:   Vitals:   04/29/22 0912  BP: 118/60  Pulse: 77  Temp: 98.2 F (36.8 C)  TempSrc: Oral  SpO2: 98%  Weight: 215 lb (97.5 kg)  Height: 6' (1.829 m)     Physical Exam Vitals reviewed.  Constitutional:      Appearance: He is well-developed.  HENT:     Head: Normocephalic and atraumatic.     Right Ear: Tympanic membrane, ear canal and external ear normal. There is no impacted cerumen.     Left Ear: Tympanic membrane, ear canal and external ear normal. There is no impacted cerumen.     Nose: No rhinorrhea.     Mouth/Throat:     Pharynx:  Posterior oropharyngeal erythema (Faint erythema posterior oropharynx without cobblestoning or exudate.  Uvula midline.) present. No oropharyngeal exudate.  Eyes:     Conjunctiva/sclera: Conjunctivae normal.     Pupils: Pupils are equal, round, and reactive to light.  Neck:     Comments: No lymphadenopathy Cardiovascular:     Rate and Rhythm: Normal rate and regular rhythm.     Heart sounds: Normal heart sounds. No murmur heard. Pulmonary:     Effort: Pulmonary effort is normal.     Breath sounds: Normal breath sounds. No wheezing, rhonchi or rales.  Abdominal:     Palpations: Abdomen is soft.     Tenderness: There is no abdominal tenderness.  Musculoskeletal:     Cervical back: Neck supple.  Lymphadenopathy:     Cervical: No cervical adenopathy.  Skin:    General: Skin is warm and dry.     Findings: No rash.  Neurological:     Mental Status: He is alert and oriented to person, place, and time.  Psychiatric:        Behavior: Behavior normal.       Results for orders placed or performed in visit on 04/29/22  POCT rapid strep A  Result Value Ref Range   Rapid Strep A Screen Positive (A) Negative     Assessment & Plan:  Robert Harrell is a 41 y.o. male . Sore throat - Plan: POCT rapid strep A  Strep pharyngitis - Plan: amoxicillin (AMOXIL) 500 MG capsule Clearing secretions, posterior oropharynx reassuring.  Right ear likely referred pain, reassuring exam.  Start amoxicillin, symptomatic care discussed, handout given with RTC precautions.  Meds ordered this encounter  Medications   amoxicillin (AMOXIL) 500 MG capsule    Sig: Take 1 capsule (500 mg total) by mouth 2 (two) times daily for 10 days.    Dispense:  20 capsule    Refill:  0   Patient Instructions  Strep test was positive today.  Start amoxicillin twice per day.  See information below.  Sore throat lozenges or cough drops are fine.  Tylenol or Advil temporarily is fine if needed. Hope you feel better  soon.  Strep Throat, Adult Strep throat is an infection in the throat that is caused by bacteria. It is common during the cold months of the year. It mostly affects children who are 58-35 years old. However, people of all ages can get it at any time of the year. This infection spreads from person to person (is contagious) through coughing, sneezing, or having close contact. Your health care provider may use other names to describe the infection. When strep throat affects the tonsils, it is called  tonsillitis. When it affects the back of the throat, it is called pharyngitis. What are the causes? This condition is caused by the Streptococcus pyogenes bacteria. What increases the risk? You are more likely to develop this condition if: You care for school-age children, or are around school-age children. Children are more likely to get strep throat and may spread it to others. You spend time in crowded places where the infection can spread easily. You have close contact with someone who has strep throat. What are the signs or symptoms? Symptoms of this condition include: Fever or chills. Redness, swelling, or pain in the tonsils or throat. Pain or difficulty when swallowing. White or yellow spots on the tonsils or throat. Tender glands in the neck and under the jaw. Bad smelling breath. Red rash all over the body. This is rare. How is this diagnosed? This condition is diagnosed by tests that check for the presence and the amount of bacteria that cause strep throat. They are: Rapid strep test. Your throat is swabbed and checked for the presence of bacteria. Results are usually ready in minutes. Throat culture test. Your throat is swabbed. The sample is placed in a cup that allows infections to grow. Results are usually ready in 1 or 2 days. How is this treated? This condition may be treated with: Medicines that kill germs (antibiotics). Medicines that relieve pain or fever. These  include: Ibuprofen or acetaminophen. Aspirin, only for people who are over the age of 4. Throat lozenges. Throat sprays. Follow these instructions at home: Medicines  Take over-the-counter and prescription medicines only as told by your health care provider. Take your antibiotic medicine as told by your health care provider. Do not stop taking the antibiotic even if you start to feel better. Eating and drinking  If you have trouble swallowing, try eating soft foods until your sore throat feels better. Drink enough fluid to keep your urine pale yellow. To help relieve pain, you may have: Warm fluids, such as soup and tea. Cold fluids, such as frozen desserts or popsicles. General instructions Gargle with a salt-water mixture 3-4 times a day or as needed. To make a salt-water mixture, completely dissolve -1 tsp (3-6 g) of salt in 1 cup (237 mL) of warm water. Get plenty of rest. Stay home from work or school until you have been taking antibiotics for 24 hours. Do not use any products that contain nicotine or tobacco. These products include cigarettes, chewing tobacco, and vaping devices, such as e-cigarettes. If you need help quitting, ask your health care provider. It is up to you to get your test results. Ask your health care provider, or the department that is doing the test, when your results will be ready. Keep all follow-up visits. This is important. How is this prevented?  Do not share food, drinking cups, or personal items that could cause the infection to spread to other people. Wash your hands often with soap and water for at least 20 seconds. If soap and water are not available, use hand sanitizer. Make sure that all people in your house wash their hands well. Have family members tested if they have a sore throat or fever. They may need an antibiotic if they have strep throat. Contact a health care provider if: You have swelling in your neck that keeps getting bigger. You  develop a rash, cough, or earache. You cough up a thick mucus that is green, yellow-brown, or bloody. You have pain or discomfort that does not  get better with medicine. Your symptoms seem to be getting worse. You have a fever. Get help right away if: You have new symptoms, such as vomiting, severe headache, stiff or painful neck, chest pain, or shortness of breath. You have severe throat pain, drooling, or changes in your voice. You have swelling of the neck, or the skin on the neck becomes red and tender. You have signs of dehydration, such as tiredness (fatigue), dry mouth, and decreased urination. You become increasingly sleepy, or you cannot wake up completely. Your joints become red or painful. These symptoms may represent a serious problem that is an emergency. Do not wait to see if the symptoms will go away. Get medical help right away. Call your local emergency services (911 in the [place] Do not drive yourself to the hospital. Summary Strep throat is an infection in the throat that is caused by the Streptococcus pyogenes bacteria. This infection is spread from person to person (is contagious) through coughing, sneezing, or having close contact. Take your medicines, including antibiotics, as told by your health care provider. Do not stop taking the antibiotic even if you start to feel better. To prevent the spread of germs, wash your hands well with soap and water. Have others do the same. Do not share food, drinking cups, or personal items. Get help right away if you have new symptoms, such as vomiting, severe headache, stiff or painful neck, chest pain, or shortness of breath. This information is not intended to replace advice given to you by your health care provider. Make sure you discuss any questions you have with your health care provider. Document Revised: 09/29/2020 Document Reviewed: 09/29/2020 Elsevier Patient Education  2023 Guadalupe Guerra,   Merri Ray, MD [place] 04/29/22 9:39 AM

## 2022-04-29 NOTE — Telephone Encounter (Signed)
Patient has been scheduled at the [facility] location

## 2022-04-29 NOTE — Patient Instructions (Addendum)
Strep test was positive today.  Start amoxicillin twice per day.  See information below.  Sore throat lozenges or cough drops are fine.  Tylenol or Advil temporarily is fine if needed. Hope you feel better soon.  Strep Throat, Adult Strep throat is an infection in the throat that is caused by bacteria. It is common during the cold months of the year. It mostly affects children who are 3-39 years old. However, people of all ages can get it at any time of the year. This infection spreads from person to person (is contagious) through coughing, sneezing, or having close contact. Your health care provider may use other names to describe the infection. When strep throat affects the tonsils, it is called tonsillitis. When it affects the back of the throat, it is called pharyngitis. What are the causes? This condition is caused by the Streptococcus pyogenes bacteria. What increases the risk? You are more likely to develop this condition if: You care for school-age children, or are around school-age children. Children are more likely to get strep throat and may spread it to others. You spend time in crowded places where the infection can spread easily. You have close contact with someone who has strep throat. What are the signs or symptoms? Symptoms of this condition include: Fever or chills. Redness, swelling, or pain in the tonsils or throat. Pain or difficulty when swallowing. White or yellow spots on the tonsils or throat. Tender glands in the neck and under the jaw. Bad smelling breath. Red rash all over the body. This is rare. How is this diagnosed? This condition is diagnosed by tests that check for the presence and the amount of bacteria that cause strep throat. They are: Rapid strep test. Your throat is swabbed and checked for the presence of bacteria. Results are usually ready in minutes. Throat culture test. Your throat is swabbed. The sample is placed in a cup that allows infections to  grow. Results are usually ready in 1 or 2 days. How is this treated? This condition may be treated with: Medicines that kill germs (antibiotics). Medicines that relieve pain or fever. These include: Ibuprofen or acetaminophen. Aspirin, only for people who are over the age of 27. Throat lozenges. Throat sprays. Follow these instructions at home: Medicines  Take over-the-counter and prescription medicines only as told by your health care provider. Take your antibiotic medicine as told by your health care provider. Do not stop taking the antibiotic even if you start to feel better. Eating and drinking  If you have trouble swallowing, try eating soft foods until your sore throat feels better. Drink enough fluid to keep your urine pale yellow. To help relieve pain, you may have: Warm fluids, such as soup and tea. Cold fluids, such as frozen desserts or popsicles. General instructions Gargle with a salt-water mixture 3-4 times a day or as needed. To make a salt-water mixture, completely dissolve -1 tsp (3-6 g) of salt in 1 cup (237 mL) of warm water. Get plenty of rest. Stay home from work or school until you have been taking antibiotics for 24 hours. Do not use any products that contain nicotine or tobacco. These products include cigarettes, chewing tobacco, and vaping devices, such as e-cigarettes. If you need help quitting, ask your health care provider. It is up to you to get your test results. Ask your health care provider, or the department that is doing the test, when your results will be ready. Keep all follow-up visits. This is important.  How is this prevented?  Do not share food, drinking cups, or personal items that could cause the infection to spread to other people. Wash your hands often with soap and water for at least 20 seconds. If soap and water are not available, use hand sanitizer. Make sure that all people in your house wash their hands well. Have family members tested  if they have a sore throat or fever. They may need an antibiotic if they have strep throat. Contact a health care provider if: You have swelling in your neck that keeps getting bigger. You develop a rash, cough, or earache. You cough up a thick mucus that is green, yellow-brown, or bloody. You have pain or discomfort that does not get better with medicine. Your symptoms seem to be getting worse. You have a fever. Get help right away if: You have new symptoms, such as vomiting, severe headache, stiff or painful neck, chest pain, or shortness of breath. You have severe throat pain, drooling, or changes in your voice. You have swelling of the neck, or the skin on the neck becomes red and tender. You have signs of dehydration, such as tiredness (fatigue), dry mouth, and decreased urination. You become increasingly sleepy, or you cannot wake up completely. Your joints become red or painful. These symptoms may represent a serious problem that is an emergency. Do not wait to see if the symptoms will go away. Get medical help right away. Call your local emergency services (911 in the [place] Do not drive yourself to the hospital. Summary Strep throat is an infection in the throat that is caused by the Streptococcus pyogenes bacteria. This infection is spread from person to person (is contagious) through coughing, sneezing, or having close contact. Take your medicines, including antibiotics, as told by your health care provider. Do not stop taking the antibiotic even if you start to feel better. To prevent the spread of germs, wash your hands well with soap and water. Have others do the same. Do not share food, drinking cups, or personal items. Get help right away if you have new symptoms, such as vomiting, severe headache, stiff or painful neck, chest pain, or shortness of breath. This information is not intended to replace advice given to you by your health care provider. Make sure you discuss any  questions you have with your health care provider. Document Revised: 09/29/2020 Document Reviewed: 09/29/2020 Elsevier Patient Education  [employer].

## 2022-04-29 NOTE — Telephone Encounter (Signed)
Called and spoke to patient. We cannot test for strep without an evaluation. Notified patient that we do not have any open appointments. Patient would like [place] to check other [facility] offices and give a call back if they have any openings.  Will call patient back.

## 2022-05-30 NOTE — Progress Notes (Signed)
Cardiology Office Note    Date:  06/01/2022   ID:  Robert Harrell, DOB [birth year], MRN [id number]  PCP:  [name], NP  Cardiologist:  Kathlyn Sacramento, MD  Electrophysiologist:  Virl Axe, MD   Chief Complaint: ED follow-up  History of Present Illness:   Robert Harrell is a 41 y.o. male with history of PSVT/AVNRT s/p unsuccessful catheter ablation in 2005 who presents for ED follow-up as outlined below.  He was previously followed by Dr. Atilano Median in [facility].  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  SVT ablation was carried out, however was unsuccessful and he was subsequently placed on flecainide following intolerance to beta-blocker secondary to fatigue and sexual dysfunction.  Historically, his tachypalpitations have responded to vagal maneuvers.  He has subsequently established care with Dr. Caryl Comes, last seeing him in 01/2021, with no interval SVT with plans to continue flecainide.  He was seen in the ED in [facility] on 05/28/2022 with a 1 week history of intermittent palpitations with rates into the 140s bpm that felt similar to his prior SVT.  Note indicates EKG showed a sinus tachycardia at 114 bpm with no ischemic changes.  High-sensitivity troponin normal x 2.  D-dimer normal.  He was advised to continue flecainide 50 mg twice daily and take magnesium oxide 400 mg twice daily for 3 days.  He comes in doing well from a cardiac perspective.  Leading up to his above.  The visit, he reported several episodes of intermittent palpitations that felt different than his prior known SVT.  These more recent episodes were more of a pounding and Robert of a fluttering.  He presented to the ED on 12/9 in the setting of a persistent episode that was lasting greater than 4 hours with the development of some dizziness and shortness of breath.  No frank angina.  No syncope.  He has not missed any doses of his flecainide.   He has recently started with testosterone replacement therapy secondary to hypogonadism.  He does drink 2-8 shots per night.  No tobacco or illicit substance use.  Currently asymptomatic.   Labs independently reviewed: 05/2022 - Hgb 14.4, PLT 252, potassium 3.6, BUN 13, serum creatinine 1.05, albumin 3.9, AST/ALT normal, magnesium 1.7 03/2021 - TC 159, TG 100, HDL 64, LDL 75 01/2018 - TSH normal  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    Past Surgical History:  Procedure Laterality Date   ABLATION  2006   Catheter   VASECTOMY      Current Medications: Current Meds  Medication Sig   diltiazem (CARDIZEM) 30 MG tablet Take 1 tablet (30 mg total) by mouth 3 (three) times daily.   esomeprazole (NEXIUM) 10 MG packet Take 10 mg by mouth daily.   flecainide (TAMBOCOR) 50 MG tablet TAKE 1 TABLET BY MOUTH EVERY DAY. PT MUST KEEP APPT IN AUG FOR FURTHER REFILLS   NON FORMULARY Take 1 tablet by mouth daily at 8 pm. DIINDOLYLMETHANE   Testosterone 100 MG PLLT as directed.    Allergies:   Patient has no known allergies.   Social History   Socioeconomic History   Marital status: Married    Spouse name: Not on file   Number of children: 2  Years of education: Not on file   Highest education level: Not on file  Occupational History   Occupation: Hotel manager  Tobacco Use   Smoking status: Never   Smokeless tobacco: Never  Vaping Use   Vaping Use: Never used  Substance and Sexual Activity   Alcohol use: Yes    Alcohol/week: 1.0 standard drink of alcohol    Types: 1 Standard drinks or equivalent per week    Comment: daily   Drug use: Never   Sexual activity: Not on file  Other Topics Concern   Not on file  Social History Narrative   Married.   2 children.   Works as a Theatre manager for [employer].    Enjoys spending time with family, swimming.    Social Determinants of Health   Financial Resource Strain: Not on file  Food Insecurity: Not on file  Transportation Needs: Not on file  Physical Activity: Not on file  Stress: Not on file  Social Connections: Not on file     Family History:  The patient's family history includes Alcohol abuse in his paternal grandfather; Breast cancer in his maternal grandmother; Diabetes in his paternal grandfather; Heart disease in his maternal grandfather and maternal grandmother; Lung cancer in his paternal grandmother; Mental illness in his mother and sister; Stroke in his maternal grandfather and maternal grandmother.  ROS:   12-point review of systems is negative unless otherwise noted in the HPI.   EKGs/Labs/Other Studies Reviewed:    Studies reviewed were summarized above. The additional studies were reviewed today: None available for review.  EKG:  EKG is ordered today.  The EKG ordered today demonstrates NSR, 90 bpm, baseline artifact, no acute ST-T changes  Recent Labs: No results found for requested labs within last 365 days.  Recent Lipid Panel    Component Value Date/Time   CHOL 159 03/24/2021 0929   TRIG 100.0 03/24/2021 0929   HDL 64.00 03/24/2021 0929   CHOLHDL 2 03/24/2021 0929   VLDL 20.0 03/24/2021 0929   LDLCALC 75 03/24/2021 0929    PHYSICAL EXAM:    VS:  BP (!) 130/100 (BP Location: Left Arm, Patient Position: Sitting, Cuff Size: Normal)   Pulse 90   Ht 6' (1.829 m)   Wt 209 lb (94.8 kg)   SpO2 99%   BMI 28.35 kg/m   BMI: Body mass index is 28.35 kg/m.  Physical Exam Vitals reviewed.  Constitutional:      Appearance: He is well-developed.  HENT:     Head: Normocephalic and atraumatic.  Eyes:     General:        Right eye: No discharge.        Left eye: No discharge.  Neck:     Vascular: No JVD.  Cardiovascular:     Rate and Rhythm: Normal rate and regular rhythm.     Pulses:           Posterior tibial pulses are 2+ on the right side and 2+ on the left side.     Heart sounds: Normal heart sounds, S1 normal and S2 normal. Heart sounds not distant. No midsystolic click and no opening snap. No murmur heard.    No friction rub.  Pulmonary:     Effort: Pulmonary effort is normal. No respiratory distress.     Breath sounds: Normal breath sounds. No decreased breath sounds, wheezing or rales.  Chest:     Chest wall: No tenderness.  Abdominal:     General:  There is no distension.  Musculoskeletal:     Cervical back: Normal range of motion.     Right lower leg: No edema.     Left lower leg: No edema.  Skin:    General: Skin is warm and dry.     Nails: There is no clubbing.  Neurological:     Mental Status: He is alert and oriented to person, place, and time.  Psychiatric:        Speech: Speech normal.        Behavior: Behavior normal.        Thought Content: Thought content normal.        Judgment: Judgment normal.     Wt Readings from Last 3 Encounters:  06/01/22 209 lb (94.8 kg)  04/29/22 215 lb (97.5 kg)  07/08/21 214 lb 7 oz (97.3 kg)     ASSESSMENT & PLAN:   PSVT/AVNRT: Maintaining sinus rhythm.  Place Zio patch given most recent episode of palpitations felt different than his prior known SVT.  Not on beta-blocker secondary to intolerance of fatigue and sexual dysfunction.  Prefers to avoid standing calcium channel blocker given infrequency of symptoms.  Add short acting as needed diltiazem 30 mg.  Follow-up with EP.   Disposition: F/u with Dr. Caryl Comes after [facility] patch.    Medication Adjustments/Labs and Tests Ordered: Current medicines are reviewed at length with the patient today.  Concerns regarding medicines are outlined above. Medication changes, Labs and Tests ordered today are summarized above and listed in the Patient Instructions accessible in Encounters.   Signed, Christell Faith, PA-C 06/01/2022 12:36 PM     [address] [place], [place] [address] [phone]

## 2022-06-01 ENCOUNTER — Encounter: Payer: Self-pay | Admitting: Physician Assistant

## 2022-06-01 ENCOUNTER — Ambulatory Visit (INDEPENDENT_AMBULATORY_CARE_PROVIDER_SITE_OTHER): Payer: 59

## 2022-06-01 ENCOUNTER — Ambulatory Visit: Payer: 59 | Attending: Physician Assistant | Admitting: Physician Assistant

## 2022-06-01 VITALS — BP 130/100 | HR 90 | Ht 72.0 in | Wt 209.0 lb

## 2022-06-01 DIAGNOSIS — I471 Supraventricular tachycardia, unspecified: Secondary | ICD-10-CM

## 2022-06-01 DIAGNOSIS — I4719 Other supraventricular tachycardia: Secondary | ICD-10-CM | POA: Diagnosis not present

## 2022-06-01 MED ORDER — DILTIAZEM HCL 30 MG PO TABS: 30.0000 mg | ORAL_TABLET | Freq: Three times a day (TID) | ORAL | 3 refills | Status: DC

## 2022-06-01 NOTE — Patient Instructions (Signed)
Medication Instructions:   START Diltiazem - take one tablet ('30mg'$ ) by mouth three times a day as needed.   *If you need a refill on your cardiac medications before your next appointment, please call your pharmacy*   Lab Work:  None Ordered  If you have labs (blood work) drawn today and your tests are completely normal, you will receive your results only by: [facility] (if you have MyChart) OR A paper copy in the mail If you have any lab test that is abnormal or we need to change your treatment, we will call you to review the results.   Testing/Procedures:  Your physician has recommended that you wear a Zio monitor.   This monitor is a medical device that records the heart's electrical activity. Doctors most often use these monitors to diagnose arrhythmias. Arrhythmias are problems with the speed or rhythm of the heartbeat. The monitor is a small device applied to your chest. You can wear one while you do your normal daily activities. While wearing this monitor if you have any symptoms to push the button and record what you felt. Once you have worn this monitor for the period of time provider prescribed (Usually 14 days), you will return the monitor device in the postage paid box. Once it is returned they will download the data collected and provide [place] with a report which the provider will then review and we will call you with those results. Important tips:  Avoid showering during the first 24 hours of wearing the monitor. Avoid excessive sweating to help maximize wear time. Do not submerge the device, no hot tubs, and no swimming pools. Keep any lotions or oils away from the patch. After 24 hours you may shower with the patch on. Take brief showers with your back facing the shower head.  Do not remove patch once it has been placed because that will interrupt data and decrease adhesive wear time. Push the button when you have any symptoms and write down what you were feeling. Once  you have completed wearing your monitor, remove and place into box which has postage paid and place in your outgoing mailbox.  If for some reason you have misplaced your box then call our office and we can provide another box and/or mail it off for you.   Follow-Up: At [facility], you and your health needs are our priority.  As part of our continuing mission to provide you with exceptional heart care, we have created designated Provider Care Teams.  These Care Teams include your primary Cardiologist (physician) and Advanced Practice Providers (APPs -  Physician Assistants and Nurse Practitioners) who all work together to provide you with the care you need, when you need it.  We recommend signing up for the patient portal called "MyChart".  Sign up information is provided on this After Visit Summary.  MyChart is used to connect with patients for Virtual Visits (Telemedicine).  Patients are able to view lab/test results, encounter notes, upcoming appointments, etc.  Non-urgent messages can be sent to your provider as well.   To learn more about what you can do with MyChart, go to [email or web address].    Your next appointment:  After Zio   The format for your next appointment:   In Person  Provider:   Virl Axe, MD

## 2022-06-03 DIAGNOSIS — I471 Supraventricular tachycardia, unspecified: Secondary | ICD-10-CM

## 2022-06-06 ENCOUNTER — Telehealth: Payer: Self-pay

## 2022-06-06 NOTE — Telephone Encounter (Signed)
Transition Care Management Follow-up Telephone Call Date of discharge and from where: [facility]  05/28/22 How have you been since you were released from the hospital? Better,on heart monitor Any questions or concerns? No  Items Reviewed: Did the pt receive and understand the discharge instructions provided? Yes  Medications obtained and verified? Yes  Other? No  Any new allergies since your discharge? No  Dietary orders reviewed? No Do you have support at home? Yes    Functional Questionnaire: (I = Independent and D = Dependent) ADLs: I  Bathing/Dressing- I  Meal Prep- I  Eating- I  Maintaining continence- I  Transferring/Ambulation- I  Managing Meds- I  Follow up appointments reviewed:  PCP Hospital f/u appt confirmed? No  . [facility] f/u appt confirmed? Yes  Scheduled to see Dr.Klein on 07/07/22  Are transportation arrangements needed? No  If their condition worsens, is the pt aware to call PCP or go to the Emergency Dept.? Yes Was the patient provided with contact information for the PCP's office or ED? Yes Was to pt encouraged to call back with questions or concerns? Yes

## 2022-06-23 ENCOUNTER — Telehealth: Payer: Self-pay | Admitting: Internal Medicine

## 2022-06-23 NOTE — Telephone Encounter (Signed)
  Patient Consent for Virtual Visit     Robert Harrell has provided verbal consent on 06/23/2022 for a virtual visit (video or telephone).   CONSENT FOR VIRTUAL VISIT FOR:  Robert Harrell  By participating in this virtual visit I agree to the following:  I hereby voluntarily request, consent and authorize [facility] and its employed or contracted physicians, physician assistants, nurse practitioners or other licensed health care professionals (the Practitioner), to provide me with telemedicine health care services (the "Services") as deemed necessary by the treating Practitioner. I acknowledge and consent to receive the Services by the Practitioner via telemedicine. I understand that the telemedicine visit will involve communicating with the Practitioner through live audiovisual communication technology and the disclosure of certain medical information by electronic transmission. I acknowledge that I have been given the opportunity to request an in-person assessment or other available alternative prior to the telemedicine visit and am voluntarily participating in the telemedicine visit.  I understand that I have the right to withhold or withdraw my consent to the use of telemedicine in the course of my care at any time, without affecting my right to future care or treatment, and that the Practitioner or I may terminate the telemedicine visit at any time. I understand that I have the right to inspect all information obtained and/or recorded in the course of the telemedicine visit and may receive copies of available information for a reasonable fee.  I understand that some of the potential risks of receiving the Services via telemedicine include:  Delay or interruption in medical evaluation due to technological equipment failure or disruption; Information transmitted may not be sufficient (e.g. poor resolution of images) to allow for appropriate medical decision making by the Practitioner;  and/or  In rare instances, security protocols could fail, causing a breach of personal health information.  Furthermore, I acknowledge that it is my responsibility to provide information about my medical history, conditions and care that is complete and accurate to the best of my ability. I acknowledge that Practitioner's advice, recommendations, and/or decision may be based on factors not within their control, such as incomplete or inaccurate data provided by me or distortions of diagnostic images or specimens that may result from electronic transmissions. I understand that the practice of medicine is not an exact science and that Practitioner makes no warranties or guarantees regarding treatment outcomes. I acknowledge that a copy of this consent can be made available to me via my patient portal ([facility]), or I can request a printed copy by calling the office of [facility].    I understand that my insurance will be billed for this visit.   I have read or had this consent read to me. I understand the contents of this consent, which adequately explains the benefits and risks of the Services being provided via telemedicine.  I have been provided ample opportunity to ask questions regarding this consent and the Services and have had my questions answered to my satisfaction. I give my informed consent for the services to be provided through the use of telemedicine in my medical care

## 2022-07-07 ENCOUNTER — Ambulatory Visit: Payer: 59 | Attending: Internal Medicine | Admitting: Internal Medicine

## 2022-07-07 ENCOUNTER — Encounter: Payer: Self-pay | Admitting: Internal Medicine

## 2022-07-07 VITALS — BP 138/92 | HR 86 | Ht 72.0 in | Wt 216.0 lb

## 2022-07-07 DIAGNOSIS — I471 Supraventricular tachycardia, unspecified: Secondary | ICD-10-CM | POA: Diagnosis not present

## 2022-07-07 DIAGNOSIS — R002 Palpitations: Secondary | ICD-10-CM

## 2022-07-07 MED ORDER — FLECAINIDE ACETATE 50 MG PO TABS: ORAL_TABLET | ORAL | 3 refills | Status: DC

## 2022-07-07 NOTE — Patient Instructions (Signed)
Medication Instructions:  - Your physician has recommended you make the following change in your medication:   1) CHANGE Flecainide 50 mg: - take 0.5 tablet (25 mg) by mouth TWICE daily   *If you need a refill on your cardiac medications before your next appointment, please call your pharmacy*   Lab Work: - none ordered  If you have labs (blood work) drawn today and your tests are completely normal, you will receive your results only by: [facility] (if you have MyChart) OR A paper copy in the mail If you have any lab test that is abnormal or we need to change your treatment, we will call you to review the results.   Testing/Procedures: - none ordered   Follow-Up: At [facility], you and your health needs are our priority.  As part of our continuing mission to provide you with exceptional heart care, we have created designated Provider Care Teams.  These Care Teams include your primary Cardiologist (physician) and Advanced Practice Providers (APPs -  Physician Assistants and Nurse Practitioners) who all work together to provide you with the care you need, when you need it.  We recommend signing up for the patient portal called "MyChart".  Sign up information is provided on this After Visit Summary.  MyChart is used to connect with patients for Virtual Visits (Telemedicine).  Patients are able to view lab/test results, encounter notes, upcoming appointments, etc.  Non-urgent messages can be sent to your provider as well.   To learn more about what you can do with MyChart, go to [email or web address].    Your next appointment:   1 year(s)  Provider:   Virl Axe, MD    Other Instructions N/a

## 2022-07-07 NOTE — Progress Notes (Signed)
Electrophysiology TeleHealth Note   Due to national recommendations of social distancing due to COVID 19, an audio/video telehealth visit is felt to be most appropriate for this patient at this time.  See MyChart message from today for the patient's consent to telehealth for [facility].   Date:  07/07/2022   ID:  Robert Harrell, DOB 1980/08/07, MRN [id number]  Location: patient's home  Provider location: [address], [place]  Evaluation Performed: Initial Evaluation  PCP:  [name], NP  Cardiologist:  Kathlyn Sacramento, MD  Electrophysiologist:  Virl Axe, MD   Chief Complaint:  palpitations  History of Present Illness:    Robert Harrell is a 42 y.o. 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    Past Surgical History:  Procedure Laterality Date   ABLATION  2006   Catheter   VASECTOMY      Current Outpatient Medications  Medication Sig Dispense Refill   diltiazem (CARDIZEM) 30 MG tablet Take 30 mg by mouth 3 (three) times daily as needed.     esomeprazole (NEXIUM) 10 MG packet Take 10 mg by mouth daily.     flecainide (TAMBOCOR) 50 MG tablet TAKE 1 TABLET BY MOUTH EVERY DAY. PT MUST KEEP APPT  IN AUG FOR FURTHER REFILLS 90 tablet 1   NON FORMULARY Take 1 tablet by mouth daily at 8 pm. DIINDOLYLMETHANE     Testosterone 100 MG PLLT as directed.     No current facility-administered medications for this visit.    Allergies:   Patient has no known allergies.   Social History:  The patient  reports that he has never smoked. He has never used smokeless tobacco. He reports current alcohol use of about 1.0 standard drink of alcohol per week. He reports that he does not use drugs.      ROS:  Please see the history of present illness.   All other systems are personally reviewed and negative.    Exam:    Vital Signs:  BP (!) 138/92   Pulse 86   Ht 6' (1.829 m)   Wt 216 lb (98 kg)   BMI 29.29 kg/m     Well appearing, alert and conversant, regular work of breathing,  good skin color Eyes- anicteric, neuro- grossly intact, skin- no apparent rash or lesions or cyanosis, mouth- oral mucosa is pink   Labs/Other Tests and Data Reviewed:    Recent Labs: No results found for requested labs within last 365 days.   Wt Readings from Last 3 Encounters:  07/07/22 216 lb (98 kg)  06/01/22 209 lb (94.8  kg)  04/29/22 215 lb (97.5 kg)     Other studies personally reviewed:        ASSESSMENT & PLAN:   Assessment and  Plan SVT concealed accessory pathway failed ablation 2005   Abnormal ECG   Alcohol intake exhuberant  Hypertension   BP elevated, may be related to EtOH and will need to follow   Tachypalpitations seem to be sinus ( can not exclude atrial) has some hx of OI and shower intolerance, and so have asked him to try and track any assoc with EtOH intake volumes as it could be functioning as a diuretic  Encouraged the use of ALIVECOR and reviewed with him his ZIO report and the appearance of tachycardia with antecedent pwaves      COVID 19 screen The patient denies symptoms of COVID 19 at this time.  The importance of social distancing was discussed  today.  Follow-up:  29 m Next remote: NA  Current medicines are reviewed at length with the patient today.   The patient does not have concerns regarding his medicines.  The following changes were made today:  change flecainide and refill at 25 bid from 50 daily   Labs/ tests ordered today include:  No orders of the defined types were placed in this encounter.      Patient Risk:  after full review of this patients clinical status, I feel that they are at moderate risk at this time.  Today, I have spent 11 minutes with the patient with telehealth technology discussing total .   22 min include chart review   Signed, Virl Axe, MD  07/07/2022 9:47 AM     [place] [address] [phone] (office) [phone] (fax)

## 2022-08-29 ENCOUNTER — Other Ambulatory Visit: Payer: Self-pay | Admitting: Physician Assistant

## 2022-09-12 IMAGING — DX DG CHEST 2V
2 series · 2 of 2 positions shown · non-contrast
Comparison: Chest x-ray dated July 12, 2016.

CLINICAL DATA: Chronic cough for the past 2 months with low-grade
fevers.

EXAM:
CHEST - 2 VIEW

[chest pa]
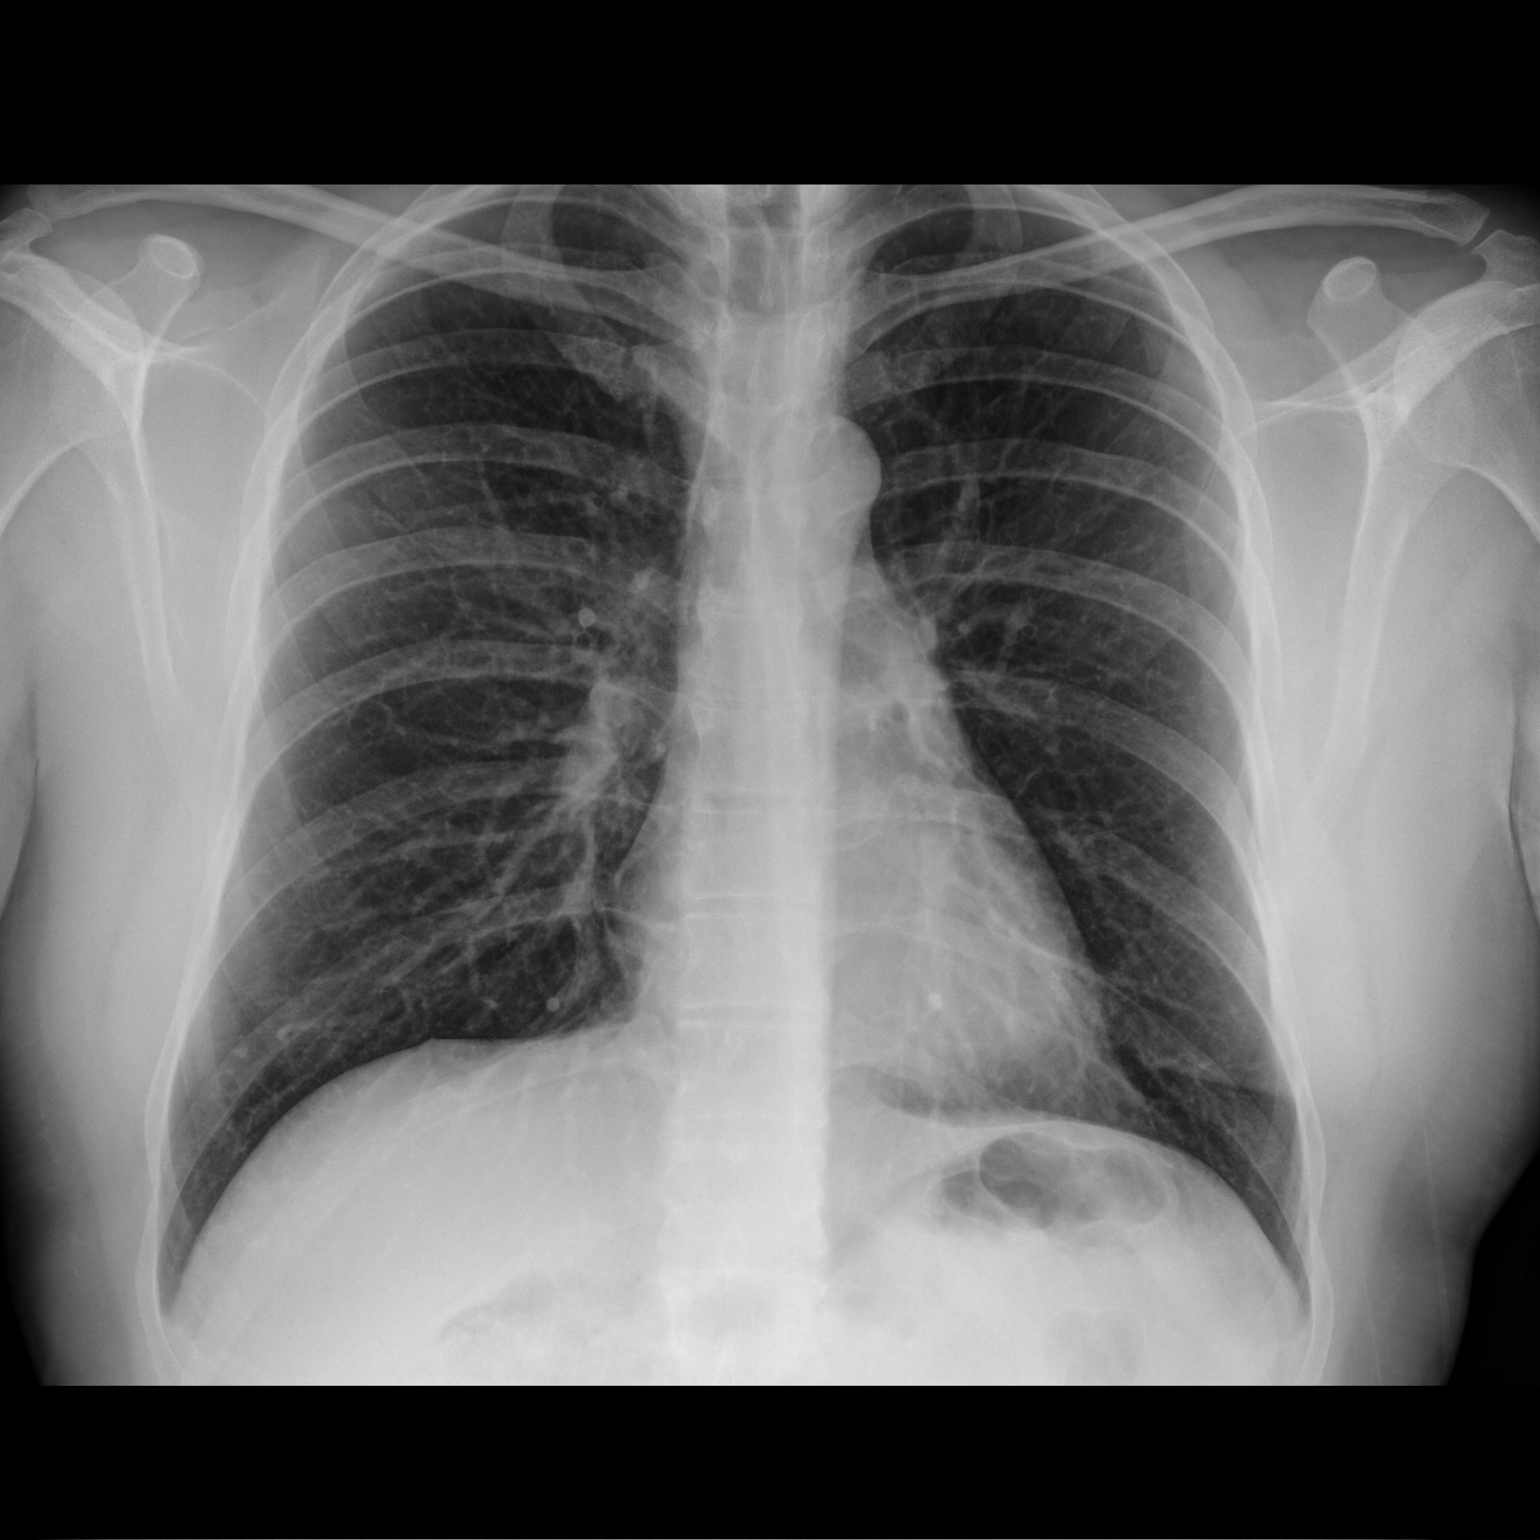

[chest lat]
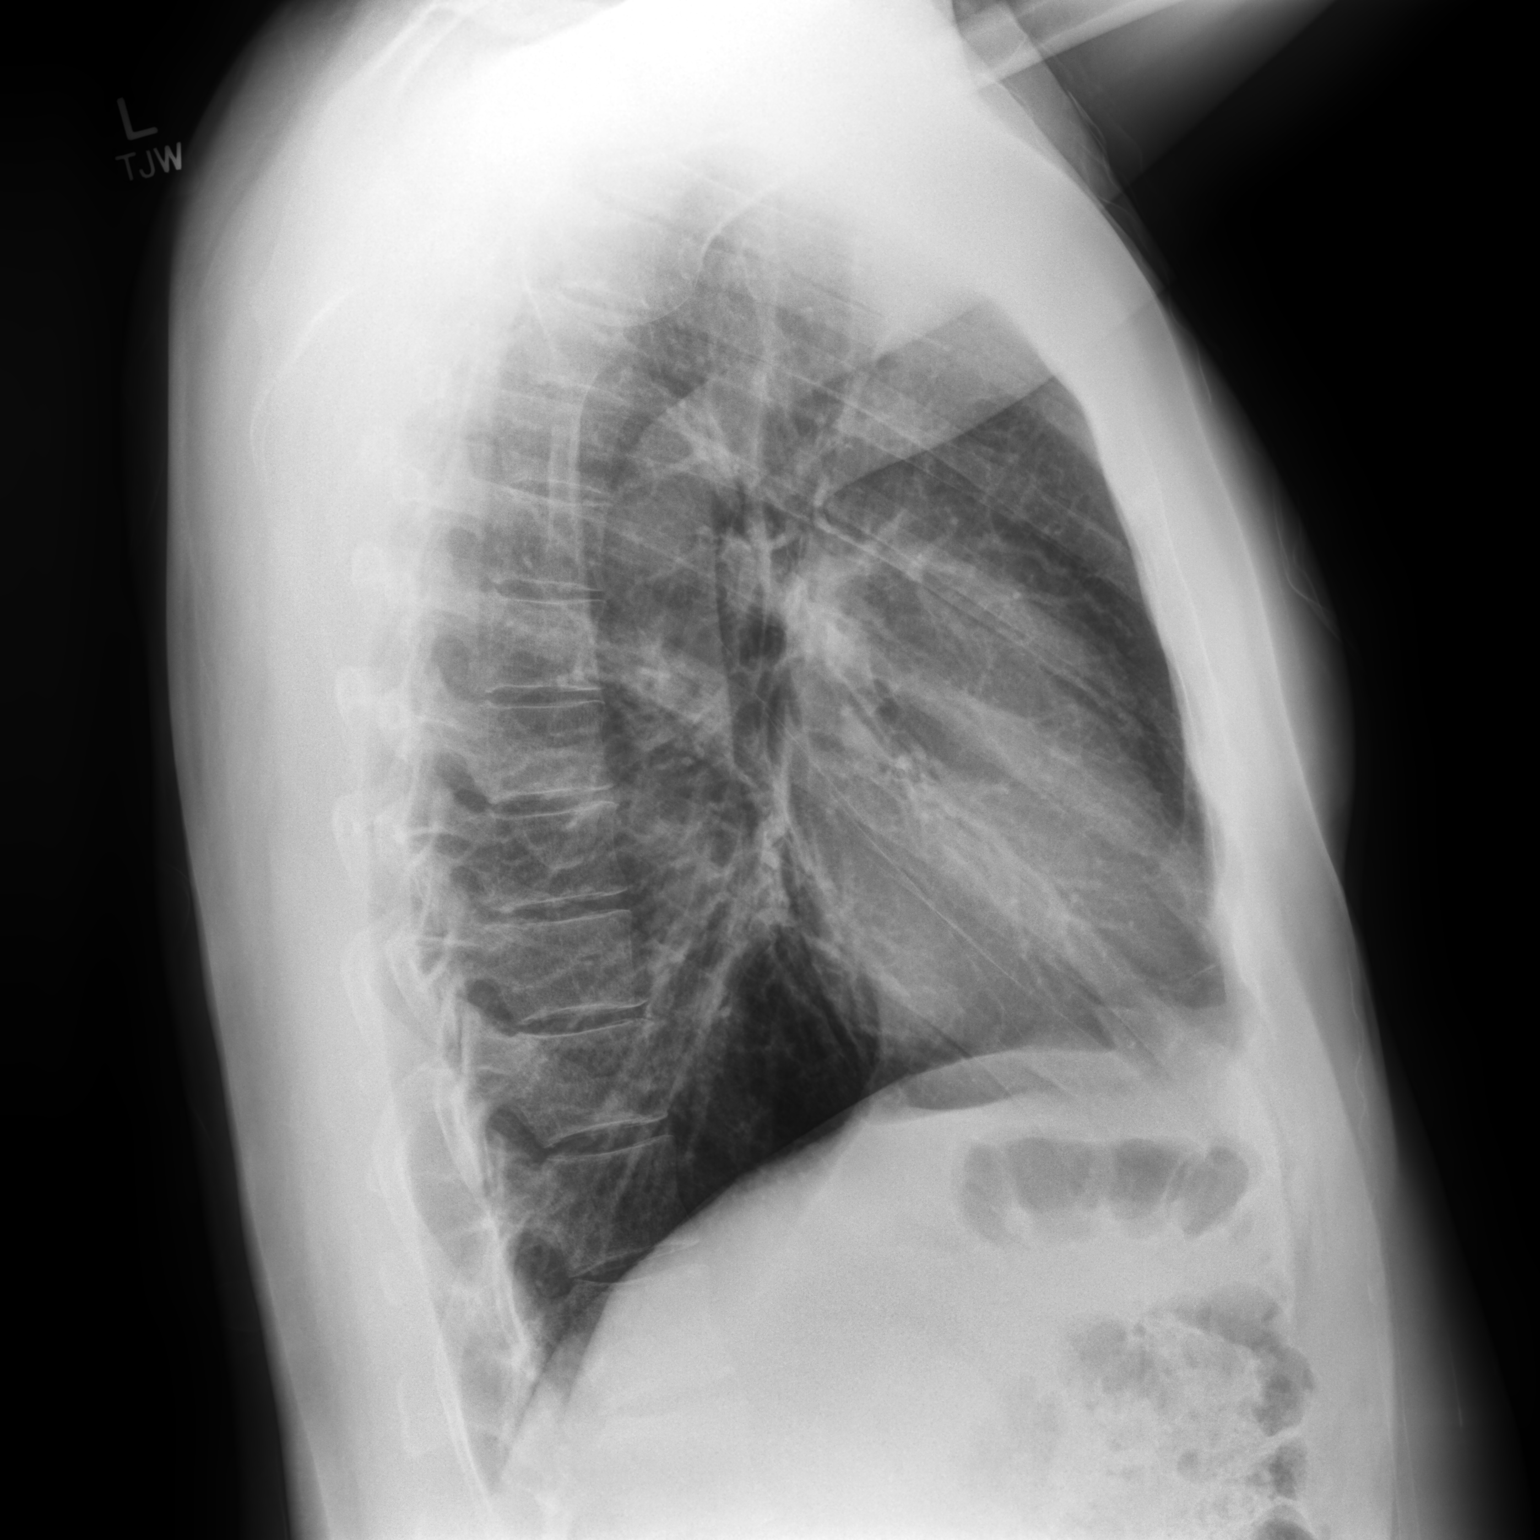

[2 of 2 positions shown; findings below may reference images not displayed]

FINDINGS: The heart size and mediastinal contours are within normal limits.
Both lungs are clear. The visualized skeletal structures are
unremarkable.
IMPRESSION: No active cardiopulmonary disease.

## 2022-11-10 ENCOUNTER — Ambulatory Visit: Payer: 59 | Admitting: Primary Care

## 2022-11-10 ENCOUNTER — Encounter: Payer: Self-pay | Admitting: Primary Care

## 2022-11-10 ENCOUNTER — Ambulatory Visit (INDEPENDENT_AMBULATORY_CARE_PROVIDER_SITE_OTHER)
Admission: RE | Admit: 2022-11-10 | Discharge: 2022-11-10 | Disposition: A | Discharge status: Home / Self Care | Payer: 59 | Source: Ambulatory Visit | Attending: Primary Care | Admitting: Primary Care

## 2022-11-10 VITALS — BP 132/78 | HR 77 | Temp 97.5°F | Ht 72.0 in | Wt 212.0 lb

## 2022-11-10 DIAGNOSIS — G8929 Other chronic pain: Secondary | ICD-10-CM

## 2022-11-10 DIAGNOSIS — M79671 Pain in right foot: Secondary | ICD-10-CM | POA: Diagnosis not present

## 2022-11-10 NOTE — Progress Notes (Signed)
Subjective:    Patient ID: Robert Harrell, male    DOB: [birth year], 42 y.o.   MRN: [id number]  Foot Pain Associated symptoms include arthralgias. Pertinent negatives include no numbness or weakness.    Robert Harrell is a very pleasant 42 y.o. male with a history of fasciculation, SVT who presents today to discuss foot pain.  He fell in December 2023, rolling his right ankle inward, fell onto the ground. He experienced pain to the dorsal foot, lateral foot, and first metatarsal. He was able to get up and walk around, did limp for a few weeks.   He continued to experience mild pain. About 1-2 months ago he rolled his ankle inward again while playing pickle ball which caused increased pain. He's been wearing an ankle and foot brace since.  His pain is worse when first getting out of bed in the morning, applying a shoe, standing after sitting for prolonged periods of time. He's not taken anything for symptoms. He has not had an xray. He denies numbness tingling now.      Review of Systems  Musculoskeletal:  Positive for arthralgias.  Skin:  Negative for color change.  Neurological:  Negative for weakness and numbness.         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    Social History   Socioeconomic History   Marital status: Married    Spouse name: Not on file   Number of children: 2   Years of education: Not on file   Highest education level: Master's degree (e.g., MA, MS, MEng, MEd, MSW, MBA)  Occupational History   Occupation: Dispensing optician  Tobacco Use   Smoking status: Never   Smokeless tobacco: Never  Vaping Use   Vaping Use: Never used  Substance and Sexual Activity   Alcohol use: Yes    Alcohol/week: 1.0 standard drink of alcohol    Types: 1 Standard drinks or  equivalent per week    Comment: daily   Drug use: Never   Sexual activity: Not on file  Other Topics Concern   Not on file  Social History Narrative   Married.   2 children.   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    Enjoys spending time with family, swimming.    Social Determinants of Health   Financial Resource Strain: Low Risk  (11/08/2022)   Overall Financial Resource Strain (CARDIA)    Difficulty of Paying Living Expenses: Not hard at all  Food Insecurity: No Food Insecurity (11/08/2022)   Hunger Vital Sign    Worried About Running Out of Food in the Last Year: Never true    Ran Out of Food in the Last Year: Never true  Transportation Needs: No Transportation Needs (11/08/2022)   PRAPARE - Administrator, Civil Service (Medical): No    Lack of Transportation (Non-Medical): No  Physical Activity: Insufficiently Active (11/08/2022)   Exercise Vital Sign    Days of Exercise per Week: 3 days    Minutes of Exercise per Session: 40 min  Stress: Stress Concern Present (11/08/2022)   [employer] of Occupational Health - Occupational Stress Questionnaire    Feeling of Stress : To some extent  Social Connections: Moderately Isolated (11/08/2022)   Social Connection and Isolation Panel [NHANES]    Frequency of  Communication with Friends and Family: Three times a week    Frequency of Social Gatherings with Friends and Family: Once a week    Attends Religious Services: Never    Database administrator or Organizations: No    Attends Engineer, structural: Not on file    Marital Status: Married  Catering manager Violence: Not on file    Past Surgical History:  Procedure Laterality Date   ABLATION  2006   Catheter   VASECTOMY      Family History  Problem Relation Age of Onset   Mental illness Mother    Mental illness Sister    Breast cancer Maternal Grandmother    Heart disease Maternal Grandmother    Stroke Maternal Grandmother    Heart disease  Maternal Grandfather    Stroke Maternal Grandfather    Lung cancer Paternal Grandmother    Alcohol abuse Paternal Grandfather    Diabetes Paternal Grandfather     No Known Allergies  Current Outpatient Medications on File Prior to Visit  Medication Sig Dispense Refill   esomeprazole (NEXIUM) 10 MG packet Take 10 mg by mouth daily.     flecainide (TAMBOCOR) 50 MG tablet Take 0.5 tablet (25 mg) by mouth twice daily 90 tablet 3   Testosterone 100 MG PLLT as directed.     diltiazem (CARDIZEM) 30 MG tablet Take 1 tablet (30 mg total) by mouth 3 (three) times daily as needed. (Patient not taking: Reported on 11/10/2022) 90 tablet 1   NON FORMULARY Take 1 tablet by mouth daily at 8 pm. DIINDOLYLMETHANE (Patient not taking: Reported on 11/10/2022)     No current facility-administered medications on file prior to visit.    BP 132/78   Pulse 77   Temp (!) 97.5 F (36.4 C) (Temporal)   Ht 6' (1.829 m)   Wt 212 lb (96.2 kg)   SpO2 99%   BMI 28.75 kg/m  Objective:   Physical Exam Constitutional:      General: He is not in acute distress. Cardiovascular:     Pulses:          Dorsalis pedis pulses are 2+ on the right side.       Posterior tibial pulses are 2+ on the right side.  Musculoskeletal:     Right foot: Normal range of motion.       Feet:  Feet:     Right foot:     Skin integrity: Skin integrity normal.     Comments: Pain with first right metatarsal joint flexion.          Assessment & Plan:  Chronic foot pain, right Assessment & Plan: Post traumatic.  Exam today without obvious deformity or fracture. Checking plain films today of the foot and ankle.  Continue bracing. Consider ortho vs podiatry referral. Await results.   Orders: -     DG Foot Complete Right        Doreene Nest, NP

## 2022-11-10 NOTE — Patient Instructions (Signed)
Complete xray(s) prior to leaving today. I will notify you of your results once received.  It was a pleasure to see you today!  

## 2022-11-10 NOTE — Assessment & Plan Note (Signed)
Post traumatic.  Exam today without obvious deformity or fracture. Checking plain films today of the foot and ankle.  Continue bracing. Consider ortho vs podiatry referral. Await results.

## 2022-11-11 DIAGNOSIS — G8929 Other chronic pain: Secondary | ICD-10-CM

## 2022-11-23 ENCOUNTER — Ambulatory Visit: Payer: 59 | Admitting: Podiatry

## 2022-11-23 ENCOUNTER — Encounter: Payer: Self-pay | Admitting: Podiatry

## 2022-11-23 ENCOUNTER — Ambulatory Visit: Payer: 59

## 2022-11-23 DIAGNOSIS — M19071 Primary osteoarthritis, right ankle and foot: Secondary | ICD-10-CM | POA: Diagnosis not present

## 2022-11-23 DIAGNOSIS — S93621A Sprain of tarsometatarsal ligament of right foot, initial encounter: Secondary | ICD-10-CM | POA: Diagnosis not present

## 2022-11-23 DIAGNOSIS — M778 Other enthesopathies, not elsewhere classified: Secondary | ICD-10-CM

## 2022-11-23 NOTE — Progress Notes (Signed)
  Subjective:  Patient ID: Robert Harrell, male    DOB: 1980-09-22,  MRN: [id number] HPI Chief Complaint  Patient presents with   Foot Injury    Lateral foot right - rolled ankle in Dec. 2023, wore a brace, helped, but still has some tenderness, see PCP-xrayed, said everything looked fine and was told to follow up with [place], also stiffness in big toe joint   New Patient (Initial Visit)    42 y.o. male presents with the above complaint.   ROS: Denies fever chills nausea vomiting muscle aches pains calf pain back pain chest pain shortness of breath.  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   Past Surgical History:  Procedure Laterality Date   ABLATION  2006   Catheter   VASECTOMY      Current Outpatient Medications:    esomeprazole (NEXIUM) 10 MG packet, Take 10 mg by mouth daily., Disp: , Rfl:    flecainide (TAMBOCOR) 50 MG tablet, Take 0.5 tablet (25 mg) by mouth twice daily, Disp: 90 tablet, Rfl: 3   Testosterone 100 MG PLLT, as directed., Disp: , Rfl:   No Known Allergies Review of Systems Objective:  There were no vitals filed for this visit.  General: Well developed, nourished, in no acute distress, alert and oriented x3   Dermatological: Skin is warm, dry and supple bilateral. Nails x 10 are well maintained; remaining integument appears unremarkable at this time. There are no open sores, no preulcerative lesions, no rash or signs of infection present.  Vascular: Dorsalis Pedis artery and Posterior Tibial artery pedal pulses are 2/4 bilateral with immedate capillary fill time. Pedal hair growth present. No varicosities and no lower extremity edema present bilateral.   Neruologic: Grossly intact via light touch bilateral. Vibratory intact via tuning fork bilateral. Protective  threshold with Semmes Wienstein monofilament intact to all pedal sites bilateral. Patellar and Achilles deep tendon reflexes 2+ bilateral. No Babinski or clonus noted bilateral.   Musculoskeletal: No gross boney pedal deformities bilateral. No pain, crepitus, or limitation noted with foot and ankle range of motion bilateral. Muscular strength 5/5 in all groups tested bilateral.  There is tenderness on palpation and end range of motion with plantarflexion of the first metatarsophalangeal joint right foot.  No ankle pain though there is some swelling over the anterior talofibular ligament area.  Negative anterior drawer.  Gait: Unassisted, Nonantalgic.    Radiographs:  Radiographs reviewed from primary care provider do not demonstrate any type of osseous abnormalities of the ankle or foot.  Assessment & Plan:   Assessment: Ankle sprain right foot capsulitis osteoarthritis right foot  Plan: Discussed appropriate shoe gear.  If this worsens he is to notify us immediately.     Seleta Hovland T. [place], [place]

## 2023-04-01 NOTE — Progress Notes (Unsigned)
Cardiology Office Note    Date:  04/04/2023   ID:  Robert Harrell, DOB [birth year], MRN [id number]  PCP:  Doreene Nest, NP  Cardiologist:  Lorine Bears, MD  Electrophysiologist:  Sherryl Manges, MD   Chief Complaint: Palpitation  History of Present Illness:   Robert Harrell is a 42 y.o. male with history of PSVT/AVNRT s/p unsuccessful catheter ablation in 2005 who presents for evaluation of palpitation.  He was previously followed by Dr. Beverely Pace in [facility].  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  SVT ablation was carried out, however was unsuccessful and he was subsequently placed on flecainide following intolerance to beta-blocker secondary to fatigue and sexual dysfunction.  Historically, his tachypalpitations have responded to vagal maneuvers.  He has subsequently established care with Dr. Graciela Husbands with plans to continue flecainide.   He was seen in the ED in [facility] on 05/28/2022 with a 1 week history of intermittent palpitations with rates into the 140s bpm that felt similar to his prior SVT.  Note indicates EKG showed a sinus tachycardia at 114 bpm with no ischemic changes.  High-sensitivity troponin normal x 2.  D-dimer normal.  He was advised to continue flecainide 50 mg twice daily.  Subsequent Zio patch in 05/2022 showed a predominant rhythm of sinus with an average rate of 91 bpm (range 62 to 161 bpm), rare PACs, and PVCs, and no significant arrhythmia.  He followed up with the EP virtually in 06/2022 with recommendation to continue current medical therapy and track symptoms, particularly with association with EtOH.  He comes in today noting intermittent randomly occurring episodes of a left external sensation described as a "bubble popping" followed by a slight pause in his heartbeat that will occur several times per day and has been more noticeable over the past 2 months.  In this setting, he has  resumed short acting diltiazem 30 mg once daily with continuation of flecainide 50 mg twice daily.  Symptoms are not noticeable with exertion.  He remains active at baseline playing strenuous/vigorous pickleball on a regular basis without cardiac limitation.  He also describes a sensation of internal itching along the chest.  He is without frank angina or cardiac decompensation.  No presyncope or syncope.  No lower extremity swelling, abdominal distention, or early satiety.  Avoids caffeine.   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    Past Surgical History:  Procedure Laterality Date   ABLATION  2006   Catheter   VASECTOMY      Current Medications: Current Meds  Medication Sig   diltiazem (CARDIZEM CD) 120 MG 24 hr capsule Take 1 capsule (120 mg total) by mouth daily.   esomeprazole (NEXIUM) 10 MG packet Take 10 mg by mouth daily.   flecainide (TAMBOCOR) 50 MG tablet Take 0.5 tablet (25 mg) by mouth twice daily (Patient taking differently: 50 mg once. Take 0.5 tablet (25 mg) by mouth twice daily)    Allergies:   Patient has no known allergies.   Social History   Socioeconomic History   Marital status: Married    Spouse name: Not on file  Number of children: 2   Years of education: Not on file   Highest education level: Master's degree (e.g., MA, MS, MEng, MEd, MSW, MBA)  Occupational History   Occupation: Dispensing optician  Tobacco Use   Smoking status: Never   Smokeless tobacco: Never  Vaping Use   Vaping status: Never Used  Substance and Sexual Activity   Alcohol use: Yes    Alcohol/week: 1.0 standard drink of alcohol     Types: 1 Standard drinks or equivalent per week    Comment: daily   Drug use: Never   Sexual activity: Not on file  Other Topics Concern   Not on file  Social History Narrative   Married.   2 children.   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    Enjoys spending time with family, swimming.    Social Determinants of Health   Financial Resource Strain: Low Risk  (11/08/2022)   Overall Financial Resource Strain (CARDIA)    Difficulty of Paying Living Expenses: Not hard at all  Food Insecurity: No Food Insecurity (11/08/2022)   Hunger Vital Sign    Worried About Running Out of Food in the Last Year: Never true    Ran Out of Food in the Last Year: Never true  Transportation Needs: No Transportation Needs (11/08/2022)   PRAPARE - Administrator, Civil Service (Medical): No    Lack of Transportation (Non-Medical): No  Physical Activity: Insufficiently Active (11/08/2022)   Exercise Vital Sign    Days of Exercise per Week: 3 days    Minutes of Exercise per Session: 40 min  Stress: Stress Concern Present (11/08/2022)   [employer] of Occupational Health - Occupational Stress Questionnaire    Feeling of Stress : To some extent  Social Connections: Moderately Isolated (11/08/2022)   Social Connection and Isolation Panel [NHANES]    Frequency of Communication with Friends and Family: Three times a week    Frequency of Social Gatherings with Friends and Family: Once a week    Attends Religious Services: Never    Database administrator or Organizations: No    Attends Engineer, structural: Not on file    Marital Status: Married     Family History:  The patient's family history includes Alcohol abuse in his paternal grandfather; Breast cancer in his maternal grandmother; Diabetes in his paternal grandfather; Heart disease in his maternal grandfather and maternal grandmother; Lung cancer in his paternal grandmother; Mental illness in his mother and sister; Stroke in his  maternal grandfather and maternal grandmother.  ROS:   12-point review of systems is negative unless otherwise noted in the HPI.   EKGs/Labs/Other Studies Reviewed:    Studies reviewed were summarized above. The additional studies were reviewed today:  Zio patch 05/2022: Patient had a min HR of 62 bpm, max HR of 161 bpm, and avg HR of 91 bpm. Predominant underlying rhythm was Sinus Rhythm.  Rare PACs and rare PVCs.   No significant arrhythmia.   EKG:  EKG is ordered today.  The EKG ordered today demonstrates NSR, 68 bpm, no acute ST-T changes  Recent Labs: No results found for requested labs within last 365 days.  Recent Lipid Panel    Component Value Date/Time   CHOL 159 03/24/2021 0929   TRIG 100.0 03/24/2021 0929   HDL 64.00 03/24/2021 0929   CHOLHDL 2 03/24/2021 0929   VLDL 20.0 03/24/2021 0929   LDLCALC 75 03/24/2021 0929    PHYSICAL EXAM:  VS:  BP (!) 136/94 (BP Location: Left Arm, Patient Position: Sitting, Cuff Size: Normal)   Pulse 68   Ht 6' (1.829 m)   Wt 206 lb (93.4 kg)   SpO2 99%   BMI 27.94 kg/m   BMI: Body mass index is 27.94 kg/m.  Physical Exam Vitals reviewed.  Constitutional:      Appearance: He is well-developed.  HENT:     Head: Normocephalic and atraumatic.  Eyes:     General:        Right eye: No discharge.        Left eye: No discharge.  Neck:     Vascular: No JVD.  Cardiovascular:     Rate and Rhythm: Normal rate and regular rhythm.     Pulses:          Posterior tibial pulses are 2+ on the right side and 2+ on the left side.     Heart sounds: Normal heart sounds, S1 normal and S2 normal. Heart sounds not distant. No midsystolic click and no opening snap. No murmur heard.    No friction rub.  Pulmonary:     Effort: Pulmonary effort is normal. No respiratory distress.     Breath sounds: Normal breath sounds. No decreased breath sounds, wheezing, rhonchi or rales.  Chest:     Chest wall: No tenderness.  Abdominal:      General: There is no distension.  Musculoskeletal:     Cervical back: Normal range of motion.     Right lower leg: No edema.     Left lower leg: No edema.  Skin:    General: Skin is warm and dry.     Nails: There is no clubbing.  Neurological:     Mental Status: He is alert and oriented to person, place, and time.  Psychiatric:        Speech: Speech normal.        Behavior: Behavior normal.        Thought Content: Thought content normal.        Judgment: Judgment normal.     Wt Readings from Last 3 Encounters:  04/04/23 206 lb (93.4 kg)  11/10/22 212 lb (96.2 kg)  07/07/22 216 lb (98 kg)     ASSESSMENT & PLAN:   Palpitation: Appears to be consistent with PVCs.  Obtain echo to evaluate for any structural abnormalities.  Trial of Cardizem CD 120 mg daily with continuation of flecainide 50 mg twice daily.  Defer to repeat Zio patch at this time in an effort to minimize cost (indicates he had to pay $250 for prior Zio patch).    Cardiac risk stratification: Schedule calcium score and echo.  PSVT/AVNRT: Overall, symptoms are stable on flecainide 50 mg twice daily.  Start Cardizem CD 120 mg daily with transition of short acting diltiazem to 30 mg as needed.   Disposition: F/u with Dr. Kirke Corin or an APP in 2 months.   Medication Adjustments/Labs and Tests Ordered: Current medicines are reviewed at length with the patient today.  Concerns regarding medicines are outlined above. Medication changes, Labs and Tests ordered today are summarized above and listed in the Patient Instructions accessible in Encounters.   Signed, Eula Listen, PA-C 04/04/2023 1:24 PM     [facility] [place] [address] [place], [place] [address] [phone]

## 2023-04-04 ENCOUNTER — Encounter: Payer: Self-pay | Admitting: Physician Assistant

## 2023-04-04 ENCOUNTER — Ambulatory Visit: Payer: 59 | Attending: Physician Assistant | Admitting: Physician Assistant

## 2023-04-04 VITALS — BP 136/94 | HR 68 | Ht 72.0 in | Wt 206.0 lb

## 2023-04-04 DIAGNOSIS — Z0000 Encounter for general adult medical examination without abnormal findings: Secondary | ICD-10-CM | POA: Diagnosis not present

## 2023-04-04 DIAGNOSIS — I471 Supraventricular tachycardia, unspecified: Secondary | ICD-10-CM

## 2023-04-04 DIAGNOSIS — I4719 Other supraventricular tachycardia: Secondary | ICD-10-CM | POA: Diagnosis not present

## 2023-04-04 DIAGNOSIS — R002 Palpitations: Secondary | ICD-10-CM | POA: Diagnosis not present

## 2023-04-04 MED ORDER — DILTIAZEM HCL ER COATED BEADS 120 MG PO CP24: 120.0000 mg | ORAL_CAPSULE | Freq: Every day | ORAL | 3 refills | Status: DC

## 2023-04-04 MED ORDER — DILTIAZEM HCL 30 MG PO TABS: 30.0000 mg | ORAL_TABLET | Freq: Every day | ORAL | 1 refills | Status: DC | PRN

## 2023-04-04 NOTE — Patient Instructions (Addendum)
Medication Instructions:  Your physician recommends the following medication changes.  START TAKING: Cardizem CD 120 mg daily Cardizem (diltiazem) 30 mg once daily as needed for palpitations  *If you need a refill on your cardiac medications before your next appointment, please call your pharmacy*   Testing/Procedures: Your physician has requested that you have an echocardiogram. Echocardiography is a painless test that uses sound waves to create images of your heart. It provides your doctor with information about the size and shape of your heart and how well your heart's chambers and valves are working.   You may receive an ultrasound enhancing agent through an IV if needed to better visualize your heart during the echo. This procedure takes approximately one hour.  There are no restrictions for this procedure.  This will take place at [address] [address], [place] [address]   CT coronary calcium score.   - $99 out of pocket cost at the time of your test - Call [phone] to schedule at your convenience.  [facility] [address] [place], [place] [address]   Coronary CalciumScan A coronary calcium scan is an imaging test used to look for deposits of calcium and other fatty materials (plaques) in the inner lining of the blood vessels of the heart (coronary arteries). These deposits of calcium and plaques can partly clog and narrow the coronary arteries without producing any symptoms or warning signs. This puts a person at risk for a heart attack. This test can detect these deposits before symptoms develop. Tell a health care provider about: Any allergies you have. All medicines you are taking, including vitamins, herbs, eye drops, creams, and over-the-counter medicines. Any problems you or family members have had with anesthetic medicines. Any blood disorders you have. Any surgeries you have had. Any  medical conditions you have. What are the risks? Generally, this is a safe procedure. However, problems may occur, including: Slight increase in the risk of cancer. This is because of the radiation involved in the test. What happens before the procedure? No preparation is needed for this procedure. What happens during the procedure? You will undress and remove any jewelry around your neck or chest. You will put on a hospital gown. Sticky electrodes will be placed on your chest. The electrodes will be connected to an electrocardiogram (ECG) machine to record a tracing of the electrical activity of your heart. A CT scanner will take pictures of your heart. During this time, you will be asked to lie still and hold your breath for 2-3 seconds while a picture of your heart is being taken. The procedure may vary among health care providers and hospitals. What happens after the procedure? You can get dressed. You can return to your normal activities. It is up to you to get the results of your test. Ask your health care provider, or the department that is doing the test, when your results will be ready. Summary A coronary calcium scan is an imaging test used to look for deposits of calcium and other fatty materials (plaques) in the inner lining of the blood vessels of the heart (coronary arteries). No preparation is needed for this procedure. A CT scanner will take pictures of your heart. You can return to your normal activities after the scan is done. This information is not intended to replace advice given to you by your health care provider. Make sure you discuss any questions you have with your health care provider.  Follow-Up: At [facility], you and your health needs are our priority.  As part of our continuing mission to provide you with exceptional heart care, we have created designated Provider Care Teams.  These Care Teams include your primary Cardiologist (physician) and  Advanced Practice Providers (APPs -  Physician Assistants and Nurse Practitioners) who all work together to provide you with the care you need, when you need it.  We recommend signing up for the patient portal called "MyChart".  Sign up information is provided on this After Visit Summary.  MyChart is used to connect with patients for Virtual Visits (Telemedicine).  Patients are able to view lab/test results, encounter notes, upcoming appointments, etc.  Non-urgent messages can be sent to your provider as well.   To learn more about what you can do with MyChart, go to [email or web address].    Your next appointment:   2 month(s)  Provider:   You may see Lorine Bears, MD or one of the following Advanced Practice Providers on your designated Care Team:   Eula Listen, [place]

## 2023-04-10 ENCOUNTER — Ambulatory Visit
Admission: RE | Admit: 2023-04-10 | Discharge: 2023-04-10 | Disposition: A | Discharge status: Home / Self Care | Payer: 59 | Source: Ambulatory Visit | Attending: Physician Assistant | Admitting: Physician Assistant

## 2023-04-10 DIAGNOSIS — Z0000 Encounter for general adult medical examination without abnormal findings: Secondary | ICD-10-CM | POA: Insufficient documentation

## 2023-04-24 ENCOUNTER — Ambulatory Visit: Payer: 59 | Attending: Physician Assistant

## 2023-04-24 DIAGNOSIS — I471 Supraventricular tachycardia, unspecified: Secondary | ICD-10-CM

## 2023-04-24 LAB — ECHOCARDIOGRAM COMPLETE
Area-P 1/2: 3.85 cm2
S' Lateral: 3.4 cm

## 2023-06-05 ENCOUNTER — Ambulatory Visit: Payer: 59 | Attending: Physician Assistant | Admitting: Physician Assistant

## 2023-06-05 NOTE — Progress Notes (Unsigned)
Cardiology Office Note:    Date:  06/05/2023  ID:  Robert Harrell, DOB 06/22/1980, MRN [id number] PCP: Doreene Nest, NP  [facility] Providers Cardiologist:  Lorine Bears, MD Electrophysiologist:  Sherryl Manges, MD { Click to update primary MD,subspecialty MD or APP then REFRESH:1}    {Click to Open Review  :1}   Patient Profile:      Robert Harrell is a 42 y.o. 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  SVT ablation was unsuccessful and he was placed on flecainide following intolerance to beta-blocker therapy secondary to sexual dysfunction and fatigue.  Historically his tachypalpitations have responded to vagal maneuvers.  He was seen in the ED on December 2023 with vomiting history of intermittent palpitations.  Note indicates EKG showed a sinus tachycardia at 114 bpm with no ischemic changes.  Subsequent Zio patch in January 2024 showed a predominant rhythm of sinus with an average rate of 91 bpm (range 62 to 161 bpm), rare PACs/PVCs and no significant arrhythmia.  He was last seen in office October 2024 where he had noted intermittent episodes of "bubble popping" followed by a slight pause in his heartbeat that would occur several times per day and most noticeable for the past 2 months.  He was started on Cardizem CD 120 mg daily with transition to short acting diltiazem to 30 mg as needed.  Echocardiogram was ordered and completed on 04/24/2023 LVEF 60-65%, no RWMA, normal diastolic parameters, RV SF normal, mild mitral valve regurgitation.  CT cardiac scoring ordered and completed on 04/10/2023 showing a coronary calcium score of 0.       History of Present Illness:  Discussed the use of AI scribe software for clinical note transcription with the patient, who gave verbal consent to proceed.  Robert Harrell is a 42 y.o.  male who returns for ***Discussed the use of AI scribe software for clinical note transcription with the patient, who gave verbal consent to proceed.  History of Present Illness            ROS   See HPI ***    Studies Reviewed:       Echocardiogram 04/24/2023  1. Left ventricular ejection fraction, by estimation, is 60 to 65%. The  left ventricle has normal function. The left ventricle has no regional  wall motion abnormalities. Left ventricular diastolic parameters were  normal.   2. Right ventricular systolic function is normal. The right ventricular  size is normal.   3. The mitral valve is normal in structure. Mild mitral valve  regurgitation.   4. The aortic valve is tricuspid. Aortic valve regurgitation is not  visualized.   5. The inferior vena cava is normal in size with greater than 50%  respiratory variability, suggesting right atrial pressure of 3 mmHg.   CT Cardiac Scoring 05/04/2023 IMPRESSION AND RECOMMENDATION: 1. Coronary calcium score of 0.   2. CAC 0, CAC-DRS A0.   3. Continue heart healthy lifestyle and risk factor modification.  ZIO 06/01/2022 Patient had a min HR of 62 bpm, max HR of 161 bpm, and avg HR of 91 bpm. Predominant underlying rhythm was Sinus Rhythm.  Rare PACs and rare PVCs.   No significant arrhythmia. Risk Assessment/Calculations:   {Does this patient have ATRIAL FIBRILLATION?:[phone]} No BP recorded.  {Refresh Note OR Click here to enter BP  :1}***  Physical Exam:   VS:  There were no vitals taken for this visit.   Wt Readings from Last 3 Encounters:  04/04/23 206 lb (93.4 kg)  11/10/22 212 lb (96.2 kg)  07/07/22 216 lb (98 kg)    Physical Exam***     Assessment and Plan:                {Are you ordering a CV Procedure (e.g. stress test, cath, DCCV, TEE, etc)?   Press F2        :[id number]}  Dispo:  No follow-ups on file.  Signed, Denyce Robert, NP

## 2023-06-12 ENCOUNTER — Telehealth: Payer: Self-pay | Admitting: Physician Assistant

## 2023-06-12 NOTE — Telephone Encounter (Signed)
Called patient to schedule appointment due to no show. Patient stated results show that he is doing well and that the medication prescribed is working well for him. Also, due to cost will not reschedule at this time. Will contact us as needed.

## 2023-07-29 ENCOUNTER — Other Ambulatory Visit: Payer: Self-pay | Admitting: Internal Medicine

## 2023-07-31 NOTE — Telephone Encounter (Signed)
 Please contact pt for future appointment. Pt due for 12 month f/u with Dr. Rodolfo Clan.

## 2023-08-01 NOTE — Telephone Encounter (Signed)
LMOV to schedule

## 2023-08-02 NOTE — Telephone Encounter (Signed)
Last office visit:  07/07/22 with plan to f/u in 12 months Next office visit: none/does have active recall

## 2023-08-11 ENCOUNTER — Ambulatory Visit: Payer: 59 | Attending: Internal Medicine | Admitting: Internal Medicine

## 2023-08-11 ENCOUNTER — Encounter: Payer: Self-pay | Admitting: Internal Medicine

## 2023-08-11 VITALS — BP 110/62 | HR 76 | Ht 72.0 in | Wt 213.0 lb

## 2023-08-11 DIAGNOSIS — I471 Supraventricular tachycardia, unspecified: Secondary | ICD-10-CM

## 2023-08-11 DIAGNOSIS — I4719 Other supraventricular tachycardia: Secondary | ICD-10-CM | POA: Diagnosis not present

## 2023-08-11 DIAGNOSIS — R002 Palpitations: Secondary | ICD-10-CM | POA: Diagnosis not present

## 2023-08-11 MED ORDER — DILTIAZEM HCL ER COATED BEADS 120 MG PO CP24: 120.0000 mg | ORAL_CAPSULE | Freq: Every day | ORAL | 3 refills | Status: DC

## 2023-08-11 MED ORDER — FLECAINIDE ACETATE 50 MG PO TABS: 25.0000 mg | ORAL_TABLET | Freq: Two times a day (BID) | ORAL | 3 refills | Status: DC

## 2023-08-11 NOTE — Progress Notes (Signed)
      Patient Care Team: Doreene Nest, NP as PCP - General (Internal Medicine) Iran Ouch, MD as PCP - Cardiology (Cardiology) Duke Salvia, MD as PCP - Electrophysiology (Cardiology)   HPI  Robert Harrell is a 43 y.o. male Seen in follow-up for SVT for which he underwent catheter ablation in 2005 which failed.  It was unclear from the information as to whether he had AV nodal reentry or anteroseptal accessory pathway.  He has been managed for  on once daily flecainide without recurrences for more than 10 years  At initial visit 5/21 we discussed twice daily flecainide and catheter ablation as alternatives to his current strategy; he was inclined to continue as he was  No interval tachy palpitations.  Fatigue.  Some cognitive issues.  Post COVID.  Wondering about hypotestosteronism he is  DATE TEST EF   11/24 Echo   60-65 %                Alcohol intake exuberant 4-6 shots a day  Denies depression  Records and Results Reviewed   Past Medical History:  Diagnosis Date   Chickenpox    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    Past Surgical History:  Procedure Laterality Date   ABLATION  2006   Catheter   VASECTOMY      Current Meds  Medication Sig   diltiazem (CARDIZEM CD) 120 MG 24 hr capsule Take 1 capsule (120 mg total) by mouth daily.   flecainide (TAMBOCOR) 50 MG tablet Take 0.5 tablets (25 mg total) by mouth 2 (two) times daily. Overdue follow up visit. PLEASE CALL OFFICE TO SCHEDULE APPOINTMENT PRIOR TO NEXT REFILL (first attempt)   Loratadine (CLARITIN) 10 MG CAPS    omeprazole (PRILOSEC OTC) 20 MG tablet Take 20 mg by mouth daily.    No Known Allergies    Review of Systems negative except from HPI and PMH  Physical Exam BP 110/62 (BP Location: Left Arm, Patient Position: Sitting, Cuff  Size: Normal)   Pulse 76   Ht 6' (1.829 m)   Wt 213 lb (96.6 kg)   SpO2 98%   BMI 28.89 kg/m  Well developed and well nourished in no acute distress HENT normal E scleral and icterus clear Neck Supple JVP flat; carotids brisk and full Clear to ausculation Regular rate and rhythm, no murmurs gallops or rub Soft with active bowel sounds No clubbing cyanosis  Edema Alert and oriented, grossly normal motor and sensory function Skin Warm and Dry  ECG    CrCl cannot be calculated (Patient's most recent lab result is older than the maximum 21 days allowed.).   Assessment and  Plan SVT concealed accessory pathway failed ablation 2005   Abnormal ECG     No interval SVT  continues to take flecainide once daily-- he thinks he might try and come off for a while -- I have no problems with that but to let us know if that is what he decides    Current medicines are reviewed at length with the patient today .  The patient does not  have concerns regarding medicines.

## 2023-08-11 NOTE — Patient Instructions (Signed)
 Medication Instructions:  The current medical regimen is effective;  continue present plan and medications.  *If you need a refill on your cardiac medications before your next appointment, please call your pharmacy*   Follow-Up: At [facility], you and your health needs are our priority.  As part of our continuing mission to provide you with exceptional heart care, we have created designated Provider Care Teams.  These Care Teams include your primary Cardiologist (physician) and Advanced Practice Providers (APPs -  Physician Assistants and Nurse Practitioners) who all work together to provide you with the care you need, when you need it.  We recommend signing up for the patient portal called "MyChart".  Sign up information is provided on this After Visit Summary.  MyChart is used to connect with patients for Virtual Visits (Telemedicine).  Patients are able to view lab/test results, encounter notes, upcoming appointments, etc.  Non-urgent messages can be sent to your provider as well.   To learn more about what you can do with MyChart, go to [email or web address].    Your next appointment:   2 year(s)  Provider:   Nobie Putnam, MD

## 2023-12-05 NOTE — Telephone Encounter (Signed)
 Patient has not been seen in >1 year. Needs office visit.

## 2023-12-07 ENCOUNTER — Ambulatory Visit: Admitting: Primary Care

## 2023-12-07 ENCOUNTER — Encounter: Payer: Self-pay | Admitting: Primary Care

## 2023-12-07 VITALS — BP 138/84 | HR 84 | Temp 97.9°F | Ht 72.0 in | Wt 210.0 lb

## 2023-12-07 DIAGNOSIS — R21 Rash and other nonspecific skin eruption: Secondary | ICD-10-CM | POA: Diagnosis not present

## 2023-12-07 MED ORDER — NYSTATIN 100000 UNIT/GM EX CREA: 1.0000 | TOPICAL_CREAM | Freq: Two times a day (BID) | CUTANEOUS | 0 refills | Status: AC

## 2023-12-07 NOTE — Assessment & Plan Note (Signed)
 Exam today most representative of balanitis.  Given no improvement with miconazole, will start nystatin cream twice daily x 7 days.  If no improvement from there then recommended hydrocortisone 1% cream twice daily x 7 days.  We also discussed saline baths twice daily.  He will update.

## 2023-12-07 NOTE — Progress Notes (Signed)
 Subjective:    Patient ID: Robert Harrell, male    DOB: [birth year], 43 y.o.   MRN: [id number]  Rash    Robert Harrell is a very pleasant 43 y.o. male who presents today to discuss rash.  His rash is located to the tip of his penis for which she first noticed three months ago.   He denies itching, penile discharge, changes in soaps/detergents/medications. He does notice pain with intercourse, otherwise no pain. He is in a monogamous relationship for 15 years.   He's tried Monistat and Tinactin without improvement. He has a history of this rash before on the tip of his penis which resolved    Review of Systems  Genitourinary:  Negative for penile discharge and penile swelling.  Skin:  Positive for rash.         Past Medical History:  Diagnosis Date   Chickenpox    PSVT (paroxysmal supraventricular tachycardia) (HCC)    a. AVNRT w/ concealed retrograde bypass tract s/p RFCA in 2005 ([facility] [place])-->clinically unsuccessful, now on flecainide .    Social History   Socioeconomic History   Marital status: Married    Spouse name: Not on file   Number of children: 2   Years of education: Not on file   Highest education level: Master's degree (e.g., MA, MS, MEng, MEd, MSW, MBA)  Occupational History   Occupation: Dispensing optician  Tobacco Use   Smoking status: Never   Smokeless tobacco: Never  Vaping Use   Vaping status: Never Used  Substance and Sexual Activity   Alcohol use: Yes    Alcohol/week: 1.0 standard drink of alcohol    Types: 1 Standard drinks or equivalent per week    Comment: daily   Drug use: Never   Sexual activity: Not on file  Other Topics Concern   Not on file  Social History Narrative   Married.   2 children.   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    Enjoys spending time with family, swimming.    Social Drivers of Corporate investment banker Strain: Low Risk  (12/06/2023)   Overall Financial Resource Strain (CARDIA)    Difficulty of  Paying Living Expenses: Not hard at all  Food Insecurity: No Food Insecurity (12/06/2023)   Hunger Vital Sign    Worried About Running Out of Food in the Last Year: Never true    Ran Out of Food in the Last Year: Never true  Transportation Needs: No Transportation Needs (12/06/2023)   PRAPARE - Administrator, Civil Service (Medical): No    Lack of Transportation (Non-Medical): No  Physical Activity: Sufficiently Active (12/06/2023)   Exercise Vital Sign    Days of Exercise per Week: 7 days    Minutes of Exercise per Session: 60 min  Stress: Stress Concern Present (12/06/2023)   [employer] of Occupational Health - Occupational Stress Questionnaire    Feeling of Stress: Rather much  Social Connections: Moderately Isolated (12/06/2023)   Social Connection and Isolation Panel    Frequency of Communication with Friends and Family: More than three times a week    Frequency of Social Gatherings with Friends and Family: Once a week    Attends Religious Services: Never    Database administrator or Organizations: No    Attends Engineer, structural: Not on file    Marital Status: Married  Catering manager Violence: Not on file    Past Surgical History:  Procedure Laterality  Date   ABLATION  2006   Catheter   VASECTOMY      Family History  Problem Relation Age of Onset   Mental illness Mother    Mental illness Sister    Breast cancer Maternal Grandmother    Heart disease Maternal Grandmother    Stroke Maternal Grandmother    Heart disease Maternal Grandfather    Stroke Maternal Grandfather    Lung cancer Paternal Grandmother    Alcohol abuse Paternal Grandfather    Diabetes Paternal Grandfather     No Known Allergies  Current Outpatient Medications on File Prior to Visit  Medication Sig Dispense Refill   diltiazem  (CARDIZEM  CD) 120 MG 24 hr capsule Take 1 capsule (120 mg total) by mouth daily. 90 capsule 3   Loratadine (CLARITIN) 10 MG CAPS       omeprazole  (PRILOSEC OTC) 20 MG tablet Take 20 mg by mouth daily.     flecainide  (TAMBOCOR ) 50 MG tablet Take 0.5 tablets (25 mg total) by mouth 2 (two) times daily. (Patient not taking: Reported on 12/07/2023) 30 tablet 3   No current facility-administered medications on file prior to visit.    BP 138/84   Pulse 84   Temp 97.9 F (36.6 C) (Temporal)   Ht 6' (1.829 m)   Wt 210 lb (95.3 kg)   SpO2 97%   BMI 28.48 kg/m  Objective:   Physical Exam Exam conducted with a chaperone present.   Cardiovascular:     Rate and Rhythm: Normal rate.  Pulmonary:     Effort: Pulmonary effort is normal.  Genitourinary:    Penis: Uncircumcised. No tenderness, discharge, swelling or lesions.     Skin:    General: Skin is warm and dry.     Findings: Erythema present.     Comments: Flat, intact, erythematous rash to anterior side of tip of penis.    Neurological:     Mental Status: He is alert.           Assessment & Plan:  Penile rash Assessment & Plan: Exam today most representative of balanitis.  Given no improvement with miconazole, will start nystatin cream twice daily x 7 days.  If no improvement from there then recommended hydrocortisone 1% cream twice daily x 7 days.  We also discussed saline baths twice daily.  He will update.  Orders: -     Nystatin; Apply 1 Application topically 2 (two) times daily.  Dispense: 15 g; Refill: 0        Gabriel John, NP

## 2023-12-07 NOTE — Patient Instructions (Addendum)
 Cleanse the penis twice daily with saline water.   Start Nystatin cream. Apply twice daily for 1 week.  If no improvement then start hydrocortisone 1% cream twice daily for 1 week.  It was a pleasure to see you today!

## 2024-02-12 DIAGNOSIS — R21 Rash and other nonspecific skin eruption: Secondary | ICD-10-CM

## 2024-07-01 DIAGNOSIS — R21 Rash and other nonspecific skin eruption: Secondary | ICD-10-CM

## 2024-07-17 ENCOUNTER — Encounter: Payer: Self-pay | Admitting: Urology

## 2024-07-17 ENCOUNTER — Ambulatory Visit: Admitting: Urology

## 2024-07-17 VITALS — BP 136/97 | HR 82 | Ht 72.0 in | Wt 210.0 lb

## 2024-07-17 DIAGNOSIS — R339 Retention of urine, unspecified: Secondary | ICD-10-CM | POA: Diagnosis not present

## 2024-07-17 DIAGNOSIS — R399 Unspecified symptoms and signs involving the genitourinary system: Secondary | ICD-10-CM | POA: Diagnosis not present

## 2024-07-17 DIAGNOSIS — R21 Rash and other nonspecific skin eruption: Secondary | ICD-10-CM

## 2024-07-17 LAB — URINALYSIS, COMPLETE
Bilirubin, UA: NEGATIVE
Glucose, UA: NEGATIVE
Ketones, UA: NEGATIVE
Leukocytes,UA: NEGATIVE
Nitrite, UA: NEGATIVE
Protein,UA: NEGATIVE
RBC, UA: NEGATIVE
Specific Gravity, UA: <1.005 — ABNORMAL LOW (ref 1.005–1.030)
Urobilinogen, Ur: 0.2 mg/dL (ref 0.2–1.0)
pH, UA: 5.5 (ref 5.0–7.5)

## 2024-07-17 LAB — MICROSCOPIC EXAMINATION
Bacteria, UA: NONE SEEN
RBC, Urine: NONE SEEN /HPF (ref 0–2)

## 2024-07-17 LAB — BLADDER SCAN AMB NON-IMAGING: Scan Result: 199

## 2024-07-17 MED ORDER — ALFUZOSIN HCL ER 10 MG PO TB24: 10.0000 mg | ORAL_TABLET | Freq: Every day | ORAL | 0 refills | Status: AC

## 2024-07-17 MED ORDER — BETAMETHASONE DIPROPIONATE 0.05 % EX CREA: TOPICAL_CREAM | Freq: Two times a day (BID) | CUTANEOUS | 0 refills | Status: AC

## 2024-07-17 NOTE — Progress Notes (Unsigned)
 Patient presents for an office visit. BP today is High. Greater than 140/90. Provider  notified and recheck Blood Pressure 136 97 .  Pt advised to talk with PCP.  Pt voiced understanding.

## 2024-07-17 NOTE — Progress Notes (Unsigned)
 "  07/17/2024 9:01 AM   Robert Harrell 1980-09-21 [id number]  Referring provider: Gretta Comer POUR, NP [address] [place],  [place] [address]  Chief Complaint  Patient presents with   penile rash    HPI: Robert Harrell is a 44 y.o. male referred for evaluation of a penile rash.  Initially noted March 2025.  Rash is located dorsal glans near the corona.  He did have an episode where after intercourse he developed skin sloughing in this area.  No pain or discomfort but does note increased sensitivity Additional urologic ROS remarkable for a 20-month history of urinary frequency, urgency, hesitancy and a weak urinary stream IPSS today 13/35   PMH: Past Medical History:  Diagnosis Date   Chickenpox    PSVT (paroxysmal supraventricular tachycardia)    a. AVNRT w/ concealed retrograde bypass tract s/p RFCA in 2005 ([facility] [place])-->clinically unsuccessful, now on flecainide .    Surgical History: Past Surgical History:  Procedure Laterality Date   ABLATION  2006   Catheter   VASECTOMY      Home Medications:  Allergies as of 07/17/2024   No Known Allergies      Medication List        Accurate as of July 17, 2024  9:01 AM. If you have any questions, ask your nurse or doctor.          STOP taking these medications    Claritin 10 MG Caps Generic drug: Loratadine Stopped by: Glendia Barba, MD   diltiazem  120 MG 24 hr capsule Commonly known as: Cardizem  CD Stopped by: Glendia Barba, MD   flecainide  50 MG tablet Commonly known as: TAMBOCOR  Stopped by: Glendia Barba, MD   omeprazole  20 MG tablet Commonly known as: PRILOSEC OTC Stopped by: Glendia Barba, MD       TAKE these medications    nystatin  cream Commonly known as: MYCOSTATIN  Apply 1 Application topically 2 (two) times daily.        Allergies: Allergies[1]  Family History: Family History  Problem Relation Age of Onset   Mental illness Mother    Mental illness Sister     Breast cancer Maternal Grandmother    Heart disease Maternal Grandmother    Stroke Maternal Grandmother    Heart disease Maternal Grandfather    Stroke Maternal Grandfather    Lung cancer Paternal Grandmother    Alcohol abuse Paternal Grandfather    Diabetes Paternal Grandfather     Social History:  reports that he has never smoked. He has never used smokeless tobacco. He reports current alcohol use of about 1.0 standard drink of alcohol per week. He reports that he does not use drugs.   Physical Exam: BP (!) 153/93   Pulse 82   Ht 6' (1.829 m)   Wt 210 lb (95.3 kg)   BMI 28.48 kg/m   Constitutional:  Alert, No acute distress. HEENT: [place] AT Respiratory: Normal respiratory effort, no increased work of breathing. GU: Phallus uncircumcised.  Prepuce easily retracts.  Faint erythema dorsal glans near the corona measuring ~1 cm.  Meatus normal in appearance. Psychiatric: Normal mood and affect.  Laboratory Data:  Urinalysis Dipstick/microscopy negative   Assessment & Plan:    1.  Penile rash Faint macular rash dorsal glans Trial of betamethasone  0.05% twice daily x 7 days Recommend dermatology evaluation if no improvement  2.  Lower urinary tract symptoms Most likely secondary to outlet obstruction PVR today 199 mL Will start alfuzosin  10 mg daily 1  month follow-up for PVR   Glendia JAYSON Barba, MD  [facility] [address], [address] [place], [place] [address] [phone]     [1] No Known Allergies  "

## 2024-07-18 ENCOUNTER — Encounter: Payer: Self-pay | Admitting: *Deleted

## 2024-07-18 ENCOUNTER — Encounter: Payer: Self-pay | Admitting: Urology

## 2024-08-19 ENCOUNTER — Ambulatory Visit: Admitting: Physician Assistant

## 2024-08-19 ENCOUNTER — Ambulatory Visit: Admitting: Urology
